# Patient Record
Sex: Female | Born: 1935 | Race: White | Hispanic: No | Smoking: Former smoker
Health system: Southern US, Community
[De-identification: ages and names within clinical notes are randomized; demographics above are authoritative.]

## PROBLEM LIST (undated history)

## (undated) DIAGNOSIS — U071 COVID-19: Secondary | ICD-10-CM

## (undated) DIAGNOSIS — Z8719 Personal history of other diseases of the digestive system: Secondary | ICD-10-CM

## (undated) DIAGNOSIS — T7840XA Allergy, unspecified, initial encounter: Secondary | ICD-10-CM

## (undated) DIAGNOSIS — I10 Essential (primary) hypertension: Secondary | ICD-10-CM

## (undated) HISTORY — DX: Essential (primary) hypertension: I10

## (undated) HISTORY — DX: COVID-19: U07.1

## (undated) HISTORY — PX: TONSILLECTOMY: SUR1361

## (undated) HISTORY — DX: Allergy, unspecified, initial encounter: T78.40XA

## (undated) HISTORY — PX: SEPTOPLASTY: SUR1290

## (undated) HISTORY — DX: Personal history of other diseases of the digestive system: Z87.19

---

## 2017-02-09 ENCOUNTER — Ambulatory Visit (INDEPENDENT_AMBULATORY_CARE_PROVIDER_SITE_OTHER): Payer: Medicare Other | Admitting: Family Medicine

## 2017-02-09 ENCOUNTER — Encounter: Payer: Self-pay | Admitting: Family Medicine

## 2017-02-09 ENCOUNTER — Encounter (INDEPENDENT_AMBULATORY_CARE_PROVIDER_SITE_OTHER): Payer: Self-pay

## 2017-02-09 VITALS — BP 166/82 | HR 65 | Temp 98.7°F | Ht <= 58 in | Wt 133.6 lb

## 2017-02-09 DIAGNOSIS — M533 Sacrococcygeal disorders, not elsewhere classified: Secondary | ICD-10-CM | POA: Insufficient documentation

## 2017-02-09 DIAGNOSIS — R413 Other amnesia: Secondary | ICD-10-CM

## 2017-02-09 DIAGNOSIS — E663 Overweight: Secondary | ICD-10-CM

## 2017-02-09 DIAGNOSIS — E669 Obesity, unspecified: Secondary | ICD-10-CM | POA: Insufficient documentation

## 2017-02-09 DIAGNOSIS — I10 Essential (primary) hypertension: Secondary | ICD-10-CM | POA: Insufficient documentation

## 2017-02-09 DIAGNOSIS — H35379 Puckering of macula, unspecified eye: Secondary | ICD-10-CM | POA: Diagnosis not present

## 2017-02-09 MED ORDER — AMLODIPINE BESYLATE 5 MG PO TABS: 5.0000 mg | ORAL_TABLET | Freq: Every day | ORAL | 1 refills | Status: DC
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
## 2017-02-09 NOTE — Assessment & Plan Note (Signed)
Not at goal. Reports she was previously on 10 mg of amlodipine. We will increase her to 5 mg and check a CMP today.

## 2017-02-09 NOTE — Assessment & Plan Note (Signed)
Encouraged continued exercise and watching diet.

## 2017-02-09 NOTE — Assessment & Plan Note (Signed)
History of this. Refer to ophthalmology for follow-up.

## 2017-02-09 NOTE — Assessment & Plan Note (Signed)
Passed word recall. Encouraged to continue to monitor memory.
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
## 2017-02-09 NOTE — Patient Instructions (Signed)
Nice to see you. We are going to increase your amlodipine to 5 mg daily. We'll check lab work today. Please try to avoid sitting on hard surfaces for your tailbone. We'll get you to see ophthalmology as well.  I'll see back in one month to recheck blood pressure.
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
## 2017-02-10 LAB — COMPREHENSIVE METABOLIC PANEL
ALT: 14 U/L (ref 6–29)
AST: 20 U/L (ref 10–35)
Albumin: 4.5 g/dL (ref 3.6–5.1)
Alkaline Phosphatase: 67 U/L (ref 33–130)
BILIRUBIN TOTAL: 0.3 mg/dL (ref 0.2–1.2)
BUN: 11 mg/dL (ref 7–25)
CALCIUM: 10.2 mg/dL (ref 8.6–10.4)
CO2: 22 mmol/L (ref 20–31)
Chloride: 104 mmol/L (ref 98–110)
Creat: 0.76 mg/dL (ref 0.60–0.88)
GLUCOSE: 94 mg/dL (ref 65–99)
POTASSIUM: 4.4 mmol/L (ref 3.5–5.3)
Sodium: 139 mmol/L (ref 135–146)
Total Protein: 6.9 g/dL (ref 6.1–8.1)

## 2017-02-14 ENCOUNTER — Encounter: Payer: Self-pay | Admitting: Family Medicine

## 2017-03-23 ENCOUNTER — Ambulatory Visit (INDEPENDENT_AMBULATORY_CARE_PROVIDER_SITE_OTHER): Payer: Medicare Other | Admitting: Family Medicine

## 2017-03-23 ENCOUNTER — Encounter: Payer: Self-pay | Admitting: Family Medicine

## 2017-03-23 DIAGNOSIS — I10 Essential (primary) hypertension: Secondary | ICD-10-CM

## 2017-03-23 DIAGNOSIS — R9389 Abnormal findings on diagnostic imaging of other specified body structures: Secondary | ICD-10-CM

## 2017-03-23 DIAGNOSIS — R938 Abnormal findings on diagnostic imaging of other specified body structures: Secondary | ICD-10-CM

## 2017-03-23 DIAGNOSIS — R911 Solitary pulmonary nodule: Secondary | ICD-10-CM | POA: Insufficient documentation

## 2017-03-23 NOTE — Patient Instructions (Signed)
Nice to see you. Please continue the amlodipine. Please continue to monitor your blood pressure. If you start having lightheadedness please let us know. If your blood pressure starts to run significantly higher please let us know.
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
## 2017-03-23 NOTE — Assessment & Plan Note (Signed)
At goal for age. Continue current medications. Continue to monitor.
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
## 2017-04-02 DIAGNOSIS — H35372 Puckering of macula, left eye: Secondary | ICD-10-CM | POA: Diagnosis not present

## 2017-08-21 ENCOUNTER — Other Ambulatory Visit: Payer: Self-pay | Admitting: Family Medicine

## 2017-09-24 ENCOUNTER — Other Ambulatory Visit: Payer: Self-pay

## 2017-09-24 ENCOUNTER — Ambulatory Visit (INDEPENDENT_AMBULATORY_CARE_PROVIDER_SITE_OTHER): Payer: Medicare Other | Admitting: Family Medicine

## 2017-09-24 ENCOUNTER — Encounter: Payer: Self-pay | Admitting: Family Medicine

## 2017-09-24 DIAGNOSIS — R911 Solitary pulmonary nodule: Secondary | ICD-10-CM | POA: Diagnosis not present

## 2017-09-24 DIAGNOSIS — W19XXXA Unspecified fall, initial encounter: Secondary | ICD-10-CM | POA: Insufficient documentation

## 2017-09-24 DIAGNOSIS — I10 Essential (primary) hypertension: Secondary | ICD-10-CM

## 2017-09-24 DIAGNOSIS — R918 Other nonspecific abnormal finding of lung field: Secondary | ICD-10-CM

## 2017-09-24 MED ORDER — AMLODIPINE BESYLATE 10 MG PO TABS: 10.0000 mg | ORAL_TABLET | Freq: Every day | ORAL | 1 refills | Status: DC

## 2017-09-24 NOTE — Patient Instructions (Signed)
Nice to see you. We will get you set up for follow-up of the lung nodule that was previously seen on CT scan. We will increase you amlodipine to 10 mg daily.  Please check your blood pressure daily.  We will have you return in 2 weeks for BP check with nursing.

## 2017-09-24 NOTE — Progress Notes (Signed)
Gail AlarEric Boen Sterbenz, MD Phone: [phone]  [name] is a 84 y.o. female who presents today for follow-up.  Hypertension: Typically running between 150 and 160 systolically over 80s.  Taking amlodipine.  No chest pain or shortness of breath.  No edema.  She is not doing much exercise though is staying active.  Does eat fairly healthily with oatmeal and water for breakfast, salad and nuts for lunch, and a meat and vegetable for dinner.  She does add a little salt to her diet.  We finally did receive the CT scans from prior workup at an outside hospital.  She did have a lung nodule that was calcified which was felt to possibly be a granuloma.  She does have a smoking history in the distant past.  Quit 40 years ago.  Smoked less than 5 cigarettes/day when she did smoke.  She states she was recommended to have follow-up imaging.  Patient notes recurrent falls over the years.  Last fall was a week ago with no injury.  She has not had any injuries with these.  Several times they have occurred from the first or second step of a ladder.  She notes she will just be walking along at times and feel little off balance and then fall over.  Occasionally she will trip.  She notes no neurological symptoms with this.  Social History   Tobacco Use  Smoking Status Former Smoker  Smokeless Tobacco Never Used     ROS see history of present illness  Objective  Physical Exam Vitals:   09/24/17 0802 09/24/17 0815  BP: (!) 160/90 (!) 150/90  Pulse: 65   Temp: 97.9 F (36.6 C)   SpO2: 97%     BP Readings from Last 3 Encounters:  09/24/17 (!) 150/90  03/23/17 (!) 150/80  02/09/17 (!) 166/82   Wt Readings from Last 3 Encounters:  09/24/17 134 lb 3.2 oz (60.9 kg)  03/23/17 135 lb (61.2 kg)  02/09/17 133 lb 9.6 oz (60.6 kg)    Physical Exam  Constitutional: No distress.  Cardiovascular: Normal rate, regular rhythm and normal heart sounds.  Pulmonary/Chest: Effort normal and breath sounds  normal.  Musculoskeletal: She exhibits no edema.  Neurological: She is alert. Gait normal.  CN 2-12 intact, 5/5 strength in bilateral biceps, triceps, grip, quads, hamstrings, plantar and dorsiflexion, sensation to light touch intact in bilateral UE and LE, normal finger to nose, normal rapid alternating movements  Skin: Skin is warm and dry. She is not diaphoretic.     Assessment/Plan: Please see individual problem list.  Hypertension Above goal.  Increase amlodipine.  She will continue to check at home.  Recheck in 2 weeks with nursing.  Lung nodule seen on imaging study Possible calcified granuloma.  Does have a history of smoking.  Repeat CT scan ordered.  Falls Patient with recurrent issues with falls.  No injuries.  Neurologically intact.  Discussed seeing neurology given her sensation of imbalance though she declined.  Also offered physical therapy referral.  She wants to monitor.  If worsens or changes she will let us know.   Orders Placed This Encounter  Procedures  . CT CHEST NODULE FOLLOW UP LOW DOSE W/O    Standing Status:   Future    Standing Expiration Date:   11/23/2018    Order Specific Question:   Preferred imaging location?    Answer:   [facility]    Order Specific Question:   Radiology Contrast Protocol - do NOT remove file  path    Answer:   [email or web address]    Meds ordered this encounter  Medications  . amLODipine (NORVASC) 10 MG tablet    Sig: Take 1 tablet (10 mg total) by mouth daily.    Dispense:  90 tablet    Refill:  1     Gail Alar, MD [facility]

## 2017-09-24 NOTE — Assessment & Plan Note (Signed)
Possible calcified granuloma.  Does have a history of smoking.  Repeat CT scan ordered.

## 2017-09-24 NOTE — Assessment & Plan Note (Signed)
Patient with recurrent issues with falls.  No injuries.  Neurologically intact.  Discussed seeing neurology given her sensation of imbalance though she declined.  Also offered physical therapy referral.  She wants to monitor.  If worsens or changes she will let us know.

## 2017-09-24 NOTE — Assessment & Plan Note (Signed)
Above goal.  Increase amlodipine.  She will continue to check at home.  Recheck in 2 weeks with nursing.

## 2017-10-09 ENCOUNTER — Ambulatory Visit (INDEPENDENT_AMBULATORY_CARE_PROVIDER_SITE_OTHER): Payer: Medicare Other | Admitting: *Deleted

## 2017-10-09 DIAGNOSIS — I10 Essential (primary) hypertension: Secondary | ICD-10-CM

## 2017-10-09 NOTE — Progress Notes (Signed)
Patient came into office for 2 week follow up on BP with nurse after increasing amlodipine to 10 mg daily. BP taken in left arm 158/88 pulse 70 allowed patient to rest additional 15 minutes bp retaken left arm 140/80 pulse 78. Patient also left copy of home readings given to CMA.

## 2017-10-10 ENCOUNTER — Ambulatory Visit
Admission: RE | Admit: 2017-10-10 | Discharge: 2017-10-10 | Disposition: A | Discharge status: Home / Self Care | Payer: Medicare Other | Source: Ambulatory Visit | Attending: Family Medicine | Admitting: Family Medicine

## 2017-10-10 DIAGNOSIS — R918 Other nonspecific abnormal finding of lung field: Secondary | ICD-10-CM | POA: Diagnosis not present

## 2017-10-10 DIAGNOSIS — I700 Atherosclerosis of aorta: Secondary | ICD-10-CM | POA: Insufficient documentation

## 2017-10-10 DIAGNOSIS — R911 Solitary pulmonary nodule: Secondary | ICD-10-CM | POA: Insufficient documentation

## 2017-10-10 DIAGNOSIS — K8020 Calculus of gallbladder without cholecystitis without obstruction: Secondary | ICD-10-CM | POA: Diagnosis not present

## 2017-10-11 ENCOUNTER — Other Ambulatory Visit: Payer: Self-pay | Admitting: Family Medicine

## 2017-10-11 DIAGNOSIS — R918 Other nonspecific abnormal finding of lung field: Secondary | ICD-10-CM

## 2017-10-11 DIAGNOSIS — E041 Nontoxic single thyroid nodule: Secondary | ICD-10-CM

## 2017-10-14 NOTE — Progress Notes (Signed)
BP adequately controlled on recheck. I will follow-up with CMA regarding readings.

## 2017-10-16 ENCOUNTER — Ambulatory Visit
Admission: RE | Admit: 2017-10-16 | Discharge: 2017-10-16 | Disposition: A | Discharge status: Home / Self Care | Payer: Medicare Other | Source: Ambulatory Visit | Attending: Family Medicine | Admitting: Family Medicine

## 2017-10-16 DIAGNOSIS — E042 Nontoxic multinodular goiter: Secondary | ICD-10-CM | POA: Insufficient documentation

## 2017-10-16 DIAGNOSIS — E041 Nontoxic single thyroid nodule: Secondary | ICD-10-CM

## 2017-10-16 NOTE — Progress Notes (Signed)
In your folder 

## 2017-10-16 NOTE — Progress Notes (Signed)
Left message to return call to inform of DR.Sonnenbergs message below, ok for pec to speak to patient   Patient's blood pressure is mostly well controlled at home.  The majority of her blood pressures are less than 150 systolically.  She should continue to monitor and if she starts to have blood pressures greater than 150/90 systolically she should let us know.

## 2017-10-16 NOTE — Progress Notes (Signed)
Patient's blood pressure is mostly well controlled at home.  The majority of her blood pressures are less than 150 systolically.  She should continue to monitor and if she starts to have blood pressures greater than 150/90 systolically she should let us know.

## 2017-10-17 ENCOUNTER — Other Ambulatory Visit: Payer: Self-pay | Admitting: Family Medicine

## 2017-10-17 DIAGNOSIS — E041 Nontoxic single thyroid nodule: Secondary | ICD-10-CM

## 2017-11-06 DIAGNOSIS — E041 Nontoxic single thyroid nodule: Secondary | ICD-10-CM | POA: Diagnosis not present

## 2017-11-09 ENCOUNTER — Other Ambulatory Visit: Payer: Self-pay | Admitting: Otolaryngology

## 2017-11-09 DIAGNOSIS — E041 Nontoxic single thyroid nodule: Secondary | ICD-10-CM

## 2017-11-19 ENCOUNTER — Ambulatory Visit
Admission: RE | Admit: 2017-11-19 | Discharge: 2017-11-19 | Disposition: A | Discharge status: Home / Self Care | Payer: Medicare Other | Source: Ambulatory Visit | Attending: Otolaryngology | Admitting: Otolaryngology

## 2017-11-19 DIAGNOSIS — E041 Nontoxic single thyroid nodule: Secondary | ICD-10-CM | POA: Insufficient documentation

## 2017-11-19 NOTE — Procedures (Signed)
Interventional Radiology Procedure Note  Procedure: US guided bx of left thyroid nodule.  AFIRMA also sent Complications: None Recommendations:  - Ok to DC now - Routine care   Signed,  Yvone NeuJaime S. Loreta AveWagner, DO

## 2017-11-21 ENCOUNTER — Telehealth: Payer: Self-pay

## 2017-11-21 NOTE — Telephone Encounter (Signed)
Copied from CRM [id number]. Topic: Quick Communication - Other Results >> Nov 21, 2017  9:01 AM Crist InfanteHarrald, Kathy J wrote: Pt would like results of thyroid biopsy done 3/11.  Pt is concerned because this was done in a different way with a [facility] dr[facility] Pt instructed to call her pcp for results Dr Antony HasteSonnenburg ordered the biopsy. Pt states she is going out at 10 am for a little while and ok to leave message.

## 2017-11-21 NOTE — Telephone Encounter (Signed)
Please advise 

## 2017-11-21 NOTE — Telephone Encounter (Signed)
Patient notified and states she called dr Andee Polesvaught and they informed her they will call after he gets out of surgery

## 2017-11-21 NOTE — Telephone Encounter (Signed)
Please let the patient know that I do not see the results yet. It appears she saw Dr Andee PolesVaught for this and she should contact his office to see if they have received the results. If we get the results we will let her know, though I suspect they will go to Dr Andee PolesVaught. Thanks.

## 2017-11-22 LAB — CYTOLOGY - NON PAP

## 2018-01-30 ENCOUNTER — Encounter: Payer: Self-pay | Admitting: Family

## 2018-01-30 ENCOUNTER — Ambulatory Visit
Admission: RE | Admit: 2018-01-30 | Discharge: 2018-01-30 | Disposition: A | Discharge status: Home / Self Care | Payer: Medicare Other | Source: Ambulatory Visit | Attending: Family | Admitting: Family

## 2018-01-30 ENCOUNTER — Ambulatory Visit (INDEPENDENT_AMBULATORY_CARE_PROVIDER_SITE_OTHER): Payer: Medicare Other | Admitting: Family

## 2018-01-30 ENCOUNTER — Ambulatory Visit: Payer: Self-pay | Admitting: *Deleted

## 2018-01-30 VITALS — BP 152/74 | HR 65 | Temp 98.2°F | Resp 15 | Wt 136.2 lb

## 2018-01-30 DIAGNOSIS — I10 Essential (primary) hypertension: Secondary | ICD-10-CM

## 2018-01-30 DIAGNOSIS — M7989 Other specified soft tissue disorders: Secondary | ICD-10-CM | POA: Insufficient documentation

## 2018-01-30 DIAGNOSIS — R600 Localized edema: Secondary | ICD-10-CM | POA: Diagnosis not present

## 2018-01-30 MED ORDER — HYDROCHLOROTHIAZIDE 12.5 MG PO TABS: 12.5000 mg | ORAL_TABLET | Freq: Every day | ORAL | 0 refills | Status: DC

## 2018-01-30 NOTE — Telephone Encounter (Signed)
FYI patient will be seeing [name] today.
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
## 2018-01-30 NOTE — Patient Instructions (Addendum)
Non fasting labs on Monday to check electrolytes.   Follow up ONE week  Ultrasound of legs  Start HCTZ 12.5mg  in addition to amlodipine.   It may be the amlodipine making your legs swell.. However I am hesitant to decrease medication until your blood pressure is better controlled.  Please call us and let us know how you are doing  Managing Your Hypertension Hypertension is commonly called high blood pressure. This is when the force of your blood pressing against the walls of your arteries is too strong. Arteries are blood vessels that carry blood from your heart throughout your body. Hypertension forces the heart to work harder to pump blood, and may cause the arteries to become narrow or stiff. Having untreated or uncontrolled hypertension can cause heart attack, stroke, kidney disease, and other problems. What are blood pressure readings? A blood pressure reading consists of a higher number over a lower number. Ideally, your blood pressure should be below 120/80. The first ("top") number is called the systolic pressure. It is a measure of the pressure in your arteries as your heart beats. The second ("bottom") number is called the diastolic pressure. It is a measure of the pressure in your arteries as the heart relaxes. What does my blood pressure reading mean? Blood pressure is classified into four stages. Based on your blood pressure reading, your health care provider may use the following stages to determine what type of treatment you need, if any. Systolic pressure and diastolic pressure are measured in a unit called mm Hg. Normal  Systolic pressure: below 120.  Diastolic pressure: below 80. Elevated  Systolic pressure: 120-129.  Diastolic pressure: below 80. Hypertension stage 1  Systolic pressure: 130-139.  Diastolic pressure: 80-89. Hypertension stage 2  Systolic pressure: 140 or above.  Diastolic pressure: 90 or above. What health risks are associated with  hypertension? Managing your hypertension is an important responsibility. Uncontrolled hypertension can lead to:  A heart attack.  A stroke.  A weakened blood vessel (aneurysm).  Heart failure.  Kidney damage.  Eye damage.  Metabolic syndrome.  Memory and concentration problems.  What changes can I make to manage my hypertension? Hypertension can be managed by making lifestyle changes and possibly by taking medicines. Your health care provider will help you make a plan to bring your blood pressure within a normal range. Eating and drinking  Eat a diet that is high in fiber and potassium, and low in salt (sodium), added sugar, and fat. An example eating plan is called the DASH (Dietary Approaches to Stop Hypertension) diet. To eat this way: ? Eat plenty of fresh fruits and vegetables. Try to fill half of your plate at each meal with fruits and vegetables. ? Eat whole grains, such as whole wheat pasta, brown rice, or whole grain bread. Fill about one quarter of your plate with whole grains. ? Eat low-fat diary products. ? Avoid fatty cuts of meat, processed or cured meats, and poultry with skin. Fill about one quarter of your plate with lean proteins such as fish, chicken without skin, beans, eggs, and tofu. ? Avoid premade and processed foods. These tend to be higher in sodium, added sugar, and fat.  Reduce your daily sodium intake. Most people with hypertension should eat less than 1,500 mg of sodium a day.  Limit alcohol intake to no more than 1 drink a day for nonpregnant women and 2 drinks a day for men. One drink equals 12 oz of beer, 5 oz of wine,  or 1 oz of hard liquor. Lifestyle  Work with your health care provider to maintain a healthy body weight, or to lose weight. Ask what an ideal weight is for you.  Get at least 30 minutes of exercise that causes your heart to beat faster (aerobic exercise) most days of the week. Activities may include walking, swimming, or  biking.  Include exercise to strengthen your muscles (resistance exercise), such as weight lifting, as part of your weekly exercise routine. Try to do these types of exercises for 30 minutes at least 3 days a week.  Do not use any products that contain nicotine or tobacco, such as cigarettes and e-cigarettes. If you need help quitting, ask your health care provider.  Control any long-term (chronic) conditions you have, such as high cholesterol or diabetes. Monitoring  Monitor your blood pressure at home as told by your health care provider. Your personal target blood pressure may vary depending on your medical conditions, your age, and other factors.  Have your blood pressure checked regularly, as often as told by your health care provider. Working with your health care provider  Review all the medicines you take with your health care provider because there may be side effects or interactions.  Talk with your health care provider about your diet, exercise habits, and other lifestyle factors that may be contributing to hypertension.  Visit your health care provider regularly. Your health care provider can help you create and adjust your plan for managing hypertension. Will I need medicine to control my blood pressure? Your health care provider may prescribe medicine if lifestyle changes are not enough to get your blood pressure under control, and if:  Your systolic blood pressure is 130 or higher.  Your diastolic blood pressure is 80 or higher.  Take medicines only as told by your health care provider. Follow the directions carefully. Blood pressure medicines must be taken as prescribed. The medicine does not work as well when you skip doses. Skipping doses also puts you at risk for problems. Contact a health care provider if:  You think you are having a reaction to medicines you have taken.  You have repeated (recurrent) headaches.  You feel dizzy.  You have swelling in your  ankles.  You have trouble with your vision. Get help right away if:  You develop a severe headache or confusion.  You have unusual weakness or numbness, or you feel faint.  You have severe pain in your chest or abdomen.  You vomit repeatedly.  You have trouble breathing. Summary  Hypertension is when the force of blood pumping through your arteries is too strong. If this condition is not controlled, it may put you at risk for serious complications.  Your personal target blood pressure may vary depending on your medical conditions, your age, and other factors. For most people, a normal blood pressure is less than 120/80.  Hypertension is managed by lifestyle changes, medicines, or both. Lifestyle changes include weight loss, eating a healthy, low-sodium diet, exercising more, and limiting alcohol. This information is not intended to replace advice given to you by your health care provider. Make sure you discuss any questions you have with your health care provider. Document Released: 05/22/2012 Document Revised: 07/26/2016 Document Reviewed: 07/26/2016 Elsevier Interactive Patient Education  [employer].

## 2018-01-30 NOTE — Telephone Encounter (Addendum)
Pt called with complaints of bilateral leg swelling after eating corn beef hash; she had decided that drinking water "would wash that out"; this morning her legs are swollen from her knees to her feet; she also reports swelling of both hands; recommendations per nurse triage protocol to include seeing a physician within 24 hours; the pt normally sees Dr Birdie Sons but he has no availability within the parameters set per triage protocol;  pt offered and accepted with Rennie Plowman, [facility], today at 1500; will route to office for notification of this upcoming appointment.   Reason for Disposition . [1] MODERATE leg swelling (e.g., swelling extends up to knees) AND [2] new onset or worsening  Answer Assessment - Initial Assessment Questions 1. ONSET: "When did the swelling start?" (e.g., minutes, hours, days)     11/30/17 2. LOCATION: "What part of the leg is swollen?"  "Are both legs swollen or just one leg?"     Both legs from her knees to her feet 3. SEVERITY: "How bad is the swelling?" (e.g., localized; mild, moderate, severe)  - Localized - small area of swelling localized to one leg  - MILD pedal edema - swelling limited to foot and ankle, pitting edema < 1/4 inch (6 mm) deep, rest and elevation eliminate most or all swelling  - MODERATE edema - swelling of lower leg to knee, pitting edema > 1/4 inch (6 mm) deep, rest and elevation only partially reduce swelling  - SEVERE edema - swelling extends above knee, facial or hand swelling present      moderate 4. REDNESS: "Does the swelling look red or infected?"     no 5. PAIN: "Is the swelling painful to touch?" If so, ask: "How painful is it?"   (Scale 1-10; mild, moderate or severe)     no 6. FEVER: "Do you have a fever?" If so, ask: "What is it, how was it measured, and when did it start?"      no 7. CAUSE: "What do you think is causing the leg swelling?"     Ate salty food 8. MEDICAL HISTORY: "Do you have a history of heart failure,  kidney disease, liver failure, or cancer?"     hypertension 9. RECURRENT SYMPTOM: "Have you had leg swelling before?" If so, ask: "When was the last time?" "What happened that time?"     Yes; the pt states she was going upstairs and fell(6 years ago) 10. OTHER SYMPTOMS: "Do you have any other symptoms?" (e.g., chest pain, difficulty breathing)       no 11. PREGNANCY: "Is there any chance you are pregnant?" "When was your last menstrual period?"       no  Protocols used: LEG SWELLING AND EDEMA-A-AH

## 2018-02-01 ENCOUNTER — Encounter: Payer: Self-pay | Admitting: Family

## 2018-02-01 DIAGNOSIS — M7989 Other specified soft tissue disorders: Secondary | ICD-10-CM | POA: Insufficient documentation

## 2018-02-01 NOTE — Assessment & Plan Note (Signed)
Elevated today. Added hctz and patient will monitor blood pressure from home.  Close follow-up, appointment scheduled for 1 week.

## 2018-02-01 NOTE — Assessment & Plan Note (Addendum)
New.  Improved with elevation.  Suspect increased sodium intake as well as increased dose of amlodipine likely.  Pending BNP however low clinical suspicion for heart failure.  Blood pressures running high today, I have not decreased amlodipine dose however will consider this at follow-up.  We will go ahead and start patient on HCTZ, close follow-up.  Repeat labs in 5 days. BL [place] negative DVT.

## 2018-02-08 ENCOUNTER — Ambulatory Visit (INDEPENDENT_AMBULATORY_CARE_PROVIDER_SITE_OTHER): Payer: Medicare Other | Admitting: Family

## 2018-02-08 VITALS — BP 150/80 | HR 60 | Temp 98.3°F | Wt 135.0 lb

## 2018-02-08 DIAGNOSIS — I10 Essential (primary) hypertension: Secondary | ICD-10-CM | POA: Diagnosis not present

## 2018-02-08 LAB — BASIC METABOLIC PANEL
BUN: 13 mg/dL (ref 6–23)
CO2: 28 meq/L (ref 19–32)
CREATININE: 0.63 mg/dL (ref 0.40–1.20)
Calcium: 10.1 mg/dL (ref 8.4–10.5)
Chloride: 97 mEq/L (ref 96–112)
GFR: 95.57 mL/min (ref >60.00)
Glucose, Bld: 105 mg/dL — ABNORMAL HIGH (ref 70–99)
POTASSIUM: 4.1 meq/L (ref 3.5–5.1)
Sodium: 132 mEq/L — ABNORMAL LOW (ref 135–145)

## 2018-02-08 MED ORDER — LISINOPRIL 2.5 MG PO TABS: 2.5000 mg | ORAL_TABLET | Freq: Every day | ORAL | 1 refills | Status: DC

## 2018-02-08 NOTE — Progress Notes (Signed)
Subjective:    Patient ID: Gail Pierce, female    DOB: 10/18/1930, 84 y.o.   MRN: [id number]  CC: Gail Pierce is a 84 y.o. female who presents today for follow up.   HPI: Leg swelling has resolved. No leg pain.   HTN- No real change to blood pressure since starting HCTZ, 130/59, 131/68. Reports typically has had good blood pressure control. Notes over brother who has alzheimer's.   Denies exertional chest pain or pressure, numbness or tingling radiating to left arm or jaw, palpitations, dizziness, frequent headaches, changes in vision, or shortness of breath.    From [place]  HTN- started hctz one week ago   BL US- negative DVT   HISTORY:  Past Medical History:  Diagnosis Date  . Allergy   . Hx of diverticulitis of colon   . Hypertension    Past Surgical History:  Procedure Laterality Date  . SEPTOPLASTY    . TONSILLECTOMY     No family history on file.  Allergies: Patient has no known allergies. Current Outpatient Medications on File Prior to Visit  Medication Sig Dispense Refill  . amLODipine (NORVASC) 10 MG tablet Take 1 tablet (10 mg total) by mouth daily. 90 tablet 1  . Ascorbic Acid (VITAMIN C) 100 MG tablet Take 100 mg by mouth daily.    Marland Kitchen. aspirin EC 81 MG tablet Take 81 mg by mouth daily.    . Calcium Carbonate-Vitamin D (CALCIUM 500/D PO) Take by mouth.    . Cholecalciferol (VITAMIN D3) 1000 units CAPS Take by mouth.    Marland Kitchen. Corn Dextrin (EASY FIBER PO) Take by mouth.    . Cyanocobalamin 2500 MCG CHEW Chew by mouth.    . Glucosamine-Chondroit-Vit C-Mn (GLUCOSAMINE 1500 COMPLEX PO) Take by mouth.    . Multiple Vitamins-Minerals (CENTRUM ADULTS PO) Take by mouth.    . vitamin A 8000 UNIT capsule Take 8,000 Units by mouth daily.     No current facility-administered medications on file prior to visit.     Social History   Tobacco Use  . Smoking status: Former Games developermoker  . Smokeless tobacco: Never Used  Substance Use Topics  . Alcohol use: No  . Drug  use: No    Review of Systems  Constitutional: Negative for chills and fever.  Respiratory: Negative for cough and shortness of breath.   Cardiovascular: Negative for chest pain, palpitations and leg swelling (resolved).  Gastrointestinal: Negative for nausea and vomiting.      Objective:    BP (!) 150/80 Comment: right  Pulse 60   Temp 98.3 F (36.8 C)   Wt 135 lb (61.2 kg)   SpO2 98%   BMI 27.27 kg/m  BP Readings from Last 3 Encounters:  02/08/18 (!) 150/80  01/30/18 (!) 152/74  11/19/17 (!) 160/55   Wt Readings from Last 3 Encounters:  02/08/18 135 lb (61.2 kg)  01/30/18 136 lb 4 oz (61.8 kg)  11/19/17 135 lb (61.2 kg)    Physical Exam  Constitutional: She appears well-developed and well-nourished.  Eyes: Conjunctivae are normal.  Cardiovascular: Normal rate, regular rhythm, normal heart sounds and normal pulses.  No LE edema, palpable cords or masses. No erythema or increased warmth. No asymmetry in calf size when compared bilaterally LE hair growth symmetric and present. No discoloration of varicosities noted. LE warm and palpable pedal pulses.   Pulmonary/Chest: Effort normal and breath sounds normal. She has no wheezes. She has no rhonchi. She has no rales.  Neurological:  She is alert.  Skin: Skin is warm and dry.  Psychiatric: She has a normal mood and affect. Her speech is normal and behavior is normal. Thought content normal.  Vitals reviewed.      Assessment & Plan:   Problem List Items Addressed This Visit      Cardiovascular and Mediastinum   Hypertension - Primary    Swelling resolved. BP not quite at goal. No real improvement on HCTZ. DC HCTZ. Start low dose lisinopril. Repeat BMP today and again next week. She will f/u with PCP in July, sooner if BP is not at goal or she has side effects from lisinopril.       Relevant Medications   lisinopril (PRINIVIL,ZESTRIL) 2.5 MG tablet   Other Relevant Orders   Basic metabolic panel       I have  discontinued Gail M. Pierce's hydrochlorothiazide. I am also having her start on lisinopril. Additionally, I am having her maintain her Multiple Vitamins-Minerals (CENTRUM ADULTS PO), Calcium Carbonate-Vitamin D (CALCIUM 500/D PO), vitamin C, Glucosamine-Chondroit-Vit C-Mn (GLUCOSAMINE 1500 COMPLEX PO), Corn Dextrin (EASY FIBER PO), vitamin A, Cyanocobalamin, aspirin EC, Vitamin D3, and amLODipine.   Meds ordered this encounter  Medications  . lisinopril (PRINIVIL,ZESTRIL) 2.5 MG tablet    Sig: Take 1 tablet (2.5 mg total) by mouth daily.    Dispense:  90 tablet    Refill:  1    Order Specific Question:   Supervising Provider    Answer:   Sherlene Shams [[id number]]    Return precautions given.   Risks, benefits, and alternatives of the medications and treatment plan prescribed today were discussed, and patient expressed understanding.   Education regarding symptom management and diagnosis given to patient on AVS.  Continue to follow with Glori Luis, MD for routine health maintenance.   Dorris Carnes Varnum and I agreed with plan.   Rennie Plowman, FNP

## 2018-02-08 NOTE — Assessment & Plan Note (Signed)
Swelling resolved. BP not quite at goal. No real improvement on HCTZ. DC HCTZ. Start low dose lisinopril. Repeat BMP today and again next week. She will f/u with PCP in July, sooner if BP is not at goal or she has side effects from lisinopril.

## 2018-02-08 NOTE — Patient Instructions (Addendum)
Start lisinopril  Labs today AND again next Tuesday   Monitor blood pressure,  Goal is less than 130/80; if persistently higher, please make sooner follow up appointment so we can recheck you blood pressure and manage medications   Please ensure you feel okay on lisinopril ( not dizzy or blood pressure too low). If you feel okay, you may wait to see Birdie Sons as planned on July.    My pleasure seeing you

## 2018-02-11 ENCOUNTER — Other Ambulatory Visit: Payer: Self-pay | Admitting: Family

## 2018-02-11 NOTE — Progress Notes (Signed)
close

## 2018-02-12 ENCOUNTER — Other Ambulatory Visit (INDEPENDENT_AMBULATORY_CARE_PROVIDER_SITE_OTHER): Payer: Medicare Other

## 2018-02-12 DIAGNOSIS — I10 Essential (primary) hypertension: Secondary | ICD-10-CM | POA: Diagnosis not present

## 2018-02-12 DIAGNOSIS — M7989 Other specified soft tissue disorders: Secondary | ICD-10-CM | POA: Diagnosis not present

## 2018-02-12 LAB — COMPREHENSIVE METABOLIC PANEL
ALT: 15 U/L (ref 0–35)
AST: 18 U/L (ref 0–37)
Albumin: 4.3 g/dL (ref 3.5–5.2)
Alkaline Phosphatase: 60 U/L (ref 39–117)
BUN: 9 mg/dL (ref 6–23)
CHLORIDE: 102 meq/L (ref 96–112)
CO2: 27 mEq/L (ref 19–32)
Calcium: 10 mg/dL (ref 8.4–10.5)
Creatinine, Ser: 0.69 mg/dL (ref 0.40–1.20)
GFR: 86.05 mL/min (ref >60.00)
GLUCOSE: 102 mg/dL — AB (ref 70–99)
POTASSIUM: 4.2 meq/L (ref 3.5–5.1)
Sodium: 136 mEq/L (ref 135–145)
Total Bilirubin: 0.6 mg/dL (ref 0.2–1.2)
Total Protein: 6.8 g/dL (ref 6.0–8.3)

## 2018-02-12 LAB — BRAIN NATRIURETIC PEPTIDE: PRO B NATRI PEPTIDE: 197 pg/mL — AB (ref 0.0–100.0)

## 2018-03-25 ENCOUNTER — Encounter: Payer: Self-pay | Admitting: Family Medicine

## 2018-03-25 ENCOUNTER — Ambulatory Visit (INDEPENDENT_AMBULATORY_CARE_PROVIDER_SITE_OTHER): Payer: Medicare Other | Admitting: Family Medicine

## 2018-03-25 ENCOUNTER — Ambulatory Visit (INDEPENDENT_AMBULATORY_CARE_PROVIDER_SITE_OTHER): Payer: Medicare Other

## 2018-03-25 VITALS — BP 152/80 | HR 61 | Temp 98.0°F | Ht 59.0 in | Wt 137.0 lb

## 2018-03-25 VITALS — BP 152/80 | HR 61 | Temp 98.0°F | Resp 15 | Ht 59.0 in | Wt 137.0 lb

## 2018-03-25 DIAGNOSIS — E663 Overweight: Secondary | ICD-10-CM | POA: Diagnosis not present

## 2018-03-25 DIAGNOSIS — I10 Essential (primary) hypertension: Secondary | ICD-10-CM

## 2018-03-25 DIAGNOSIS — R911 Solitary pulmonary nodule: Secondary | ICD-10-CM

## 2018-03-25 DIAGNOSIS — M7989 Other specified soft tissue disorders: Secondary | ICD-10-CM | POA: Diagnosis not present

## 2018-03-25 DIAGNOSIS — Z0000 Encounter for general adult medical examination without abnormal findings: Secondary | ICD-10-CM | POA: Diagnosis not present

## 2018-03-25 NOTE — Progress Notes (Signed)
Subjective:   Gail Pierce is a 84 y.o. female who presents for an Initial Medicare Annual Wellness Visit.  Review of Systems    No ROS.  Medicare Wellness Visit. Additional risk factors are reflected in the social history.  Cardiac Risk Factors include: advanced age (>73men, >104 women);hypertension     Objective:    Today's Vitals   03/25/18 0836  BP: (!) 152/80  Pulse: 61  Resp: 15  Temp: 98 F (36.7 C)  TempSrc: Oral  SpO2: 99%  Weight: 137 lb (62.1 kg)  Height: 4\' 11"  (1.499 m)   Body mass index is 27.67 kg/m.  Advanced Directives 03/25/2018  Does Patient Have a Medical Advance Directive? Yes  Type of Estate agent of [place];Living will  Does patient want to make changes to medical advance directive? No - Patient declined  Copy of Healthcare Power of Attorney in Chart? No - copy requested    Current Medications (verified) Outpatient Encounter Medications as of 03/25/2018  Medication Sig  . Ascorbic Acid (VITAMIN C) 100 MG tablet Take 100 mg by mouth daily.  Marland Kitchen aspirin EC 81 MG tablet Take 81 mg by mouth daily.  . Calcium Carbonate-Vitamin D (CALCIUM 500/D PO) Take by mouth.  . Cholecalciferol (VITAMIN D3) 1000 units CAPS Take by mouth.  Marland Kitchen Corn Dextrin (EASY FIBER PO) Take by mouth.  . Cyanocobalamin 2500 MCG CHEW Chew by mouth.  . Glucosamine-Chondroit-Vit C-Mn (GLUCOSAMINE 1500 COMPLEX PO) Take by mouth.  Marland Kitchen lisinopril (PRINIVIL,ZESTRIL) 2.5 MG tablet Take 1 tablet (2.5 mg total) by mouth daily.  . Multiple Vitamins-Minerals (CENTRUM ADULTS PO) Take by mouth.  . vitamin A 8000 UNIT capsule Take 8,000 Units by mouth daily.   No facility-administered encounter medications on file as of 03/25/2018.     Allergies (verified) Patient has no known allergies.   History: Past Medical History:  Diagnosis Date  . Allergy   . Hx of diverticulitis of colon   . Hypertension    Past Surgical History:  Procedure Laterality Date  .  SEPTOPLASTY    . TONSILLECTOMY     Family History  Problem Relation Age of Onset  . Leukemia Mother   . Heart attack Father    Social History   Socioeconomic History  . Marital status: Single    Spouse name: Not on file  . Number of children: Not on file  . Years of education: Not on file  . Highest education level: Not on file  Occupational History  . Not on file  Social Needs  . Financial resource strain: Not hard at all  . Food insecurity:    Worry: Never true    Inability: Never true  . Transportation needs:    Medical: No    Non-medical: No  Tobacco Use  . Smoking status: Former Games developer  . Smokeless tobacco: Never Used  Substance and Sexual Activity  . Alcohol use: Yes    Comment: OCC  . Drug use: No  . Sexual activity: Not on file  Lifestyle  . Physical activity:    Days per week: 7 days    Minutes per session: 60 min  . Stress: Not at all  Relationships  . Social connections:    Talks on phone: Not on file    Gets together: Not on file    Attends religious service: Not on file    Active member of club or organization: Not on file    Attends meetings of clubs or organizations:  Not on file    Relationship status: Not on file  Other Topics Concern  . Not on file  Social History Narrative  . Not on file    Tobacco Counseling Counseling given: Not Answered   Clinical Intake:  Pre-visit preparation completed: Yes  Pain : No/denies pain     Nutritional Status: BMI 25 -29 Overweight Diabetes: No  How often do you need to have someone help you when you read instructions, pamphlets, or other written materials from your doctor or pharmacy?: 1 - Never  Interpreter Needed?: No      Activities of Daily Living In your present state of health, do you have any difficulty performing the following activities: 03/25/2018  Hearing? N  Vision? N  Difficulty concentrating or making decisions? N  Walking or climbing stairs? N  Dressing or bathing? N    Doing errands, shopping? N  Preparing Food and eating ? N  Using the Toilet? N  In the past six months, have you accidently leaked urine? N  Do you have problems with loss of bowel control? N  Managing your Medications? N  Managing your Finances? N  Housekeeping or managing your Housekeeping? N  Some recent data might be hidden     Immunizations and Health Maintenance Immunization History  Administered Date(s) Administered  . Influenza, High Dose Seasonal PF 05/28/2017  . Pneumococcal-Unspecified 09/11/2014  . Td 09/12/2015   Health Maintenance Due  Topic Date Due  . DEXA SCAN  08/15/1998  . PNA vac Low Risk Adult (2 of 2 - PCV13) 09/12/2015    Patient Care Team: Glori LuisSonnenberg, Eric G, MD as PCP - General (Family Medicine)  Indicate any recent Medical Services you may have received from other than [facility] providers in the past year (date may be approximate).     Assessment:   This is a routine wellness examination for Gail Pierce.  The goal of the wellness visit is to assist the patient how to close the gaps in care and create a preventative care plan for the patient.   The roster of all physicians providing medical care to patient is listed in the Snapshot section of the chart.  Taking calcium VIT D as appropriate/Osteoporosis risk reviewed.    Safety issues reviewed; Smoke and carbon monoxide detectors in the home. No firearms in the home. Wears seatbelts when driving or riding with others. No violence in the home.  They do not have excessive sun exposure.  Discussed the need for sun protection: hats, long sleeves and the use of sunscreen if there is significant sun exposure.  Patient is alert, normal appearance, oriented to person/place/and time. Correctly identified the president of the [place] and recalls of 2/3 words.Performs simple calculations and can read correct time from watch face. Displays appropriate judgement.  No new identified risk were noted.  No failures at ADL's  or IADL's.  Ambulates with cane as needed.   BMI- discussed the importance of a healthy diet, water intake and the benefits of aerobic exercise. She has a healthy diet, adequate water intake, and walks for exercise.   Dental- UTD.  Sleep patterns- Sleeps 8 hours at night.  Wakes feeling rested.    Dexa scan discussed; states ordered with pcp.   Pneumococcal vaccine discussed; states awaiting records.   Patient Concerns: None at this time. Follow up with PCP as needed.  Hearing/Vision screen Hearing Screening Comments: Patient is able to hear conversational tones without difficulty.  No issues reported.   Vision Screening Comments:  Followed by [facility] (Dr. Druscilla Brownie) Wears glasses when reading  Annual visits Cataract extraction, bilateral Visual acuity not assessed per patient preference since they have regular follow up with the ophthalmologist  Dietary issues and exercise activities discussed: Current Exercise Habits: Home exercise routine, Type of exercise: walking, Time (Minutes): 60, Frequency (Times/Week): 7, Weekly Exercise (Minutes/Week): 420, Intensity: Moderate  Goals    . Maintain Healthy Lifetsyle     Stay active Healthy diet Stay hydrated      Depression Screen PHQ 2/9 Scores 03/25/2018 03/23/2017 03/23/2017  PHQ - 2 Score 0 0 0  PHQ- 9 Score - 0 -    Fall Risk Fall Risk  03/25/2018 03/23/2017  Falls in the past year? No No    Cognitive Function: MMSE - Mini Mental State Exam 03/25/2018  Orientation to time 5  Orientation to Place 5  Registration 3  Attention/ Calculation 5  Recall 2  Language- name 2 objects 2  Language- repeat 1  Language- follow 3 step command 3  Language- read & follow direction 1  Write a sentence 1  Copy design 1  Total score 29        Screening Tests Health Maintenance  Topic Date Due  . DEXA SCAN  08/15/1998  . PNA vac Low Risk Adult (2 of 2 - PCV13) 09/12/2015  . INFLUENZA VACCINE  04/11/2018  .  TETANUS/TDAP  09/11/2025     Plan:   End of life planning; Advance aging; Advanced directives discussed. Copy of current HCPOA/Living Will requested.    I have personally reviewed and noted the following in the patient's chart:   . Medical and social history . Use of alcohol, tobacco or illicit drugs  . Current medications and supplements . Functional ability and status . Nutritional status . Physical activity . Advanced directives . List of other physicians . Hospitalizations, surgeries, and ER visits in previous 12 months . Vitals . Screenings to include cognitive, depression, and falls . Referrals and appointments  In addition, I have reviewed and discussed with patient certain preventive protocols, quality metrics, and best practice recommendations. A written personalized care plan for preventive services as well as general preventive health recommendations were provided to patient.     Ashok Pall, LPN   0/98/1191

## 2018-03-25 NOTE — Progress Notes (Signed)
  Marikay AlarEric Cyanne Delmar, MD Phone: [phone]  [name] is a 84 y.o. female who presents today for f/u.  CC: htn, lung nodule, overweight  HYPERTENSION  Disease Monitoring  Home BP Monitoring 139/68 Chest pain- no    Dyspnea- no Medications  Compliance-  Taking lisinopril  Edema- resolved  Lung nodule: Patient with smoking history.  No cough or hemoptysis.  Needs repeat scan.  Overweight: Describes diet is regular.  Goes out to eat about once a month.  Exercises by walking 1.5-2 hours daily with a good pace.  Started somewhat recently as she found a partner to walk with.    Social History   Tobacco Use  Smoking Status Former Smoker  Smokeless Tobacco Never Used     ROS see history of present illness  Objective  Physical Exam Vitals:   03/25/18 0802 03/25/18 0825  BP: (!) 170/88 (!) 152/80  Pulse: 61   Temp: 98 F (36.7 C)   SpO2: 99%     BP Readings from Last 3 Encounters:  03/25/18 (!) 152/80  02/08/18 (!) 150/80  01/30/18 (!) 152/74   Wt Readings from Last 3 Encounters:  03/25/18 137 lb (62.1 kg)  02/08/18 135 lb (61.2 kg)  01/30/18 136 lb 4 oz (61.8 kg)    Physical Exam  Constitutional: No distress.  Cardiovascular: Normal rate, regular rhythm and normal heart sounds.  Pulmonary/Chest: Effort normal and breath sounds normal.  Musculoskeletal: She exhibits no edema.  Neurological: She is alert.  Skin: Skin is warm and dry. She is not diaphoretic.     Assessment/Plan: Please see individual problem list.  Leg swelling Resolved.  Lung nodule seen on imaging study Chest CT previously ordered.  We will get this set up for her.  Overweight (BMI 25.0-29.9) Encourage continue exercise and monitoring diet.  Hypertension At goal at home.  She will continue lisinopril.  She did have follow-up metabolic panel.   Health Maintenance: DEXA scan ordered.  Records will be requested regarding pneumonia vaccine.  Orders Placed This Encounter    Procedures  . DG Bone Density    Standing Status:   Future    Standing Expiration Date:   05/27/2019    Order Specific Question:   Reason for Exam (SYMPTOM  OR DIAGNOSIS REQUIRED)    Answer:   postemenopausal estrogen deficiency    Order Specific Question:   Preferred imaging location?    Answer:   [facility]    No orders of the defined types were placed in this encounter.    Marikay AlarEric Ariel Wingrove, MD [facility]

## 2018-03-25 NOTE — Patient Instructions (Addendum)
  Gail Pierce , Thank you for taking time to come for your Medicare Wellness Visit. I appreciate your ongoing commitment to your health goals. Please review the following plan we discussed and let me know if I can assist you in the future.   These are the goals we discussed: Goals    . Maintain Healthy Lifetsyle     Stay active Healthy diet Stay hydrated       This is a list of the screening recommended for you and due dates:  Health Maintenance  Topic Date Due  . DEXA scan (bone density measurement)  08/15/1998  . Pneumonia vaccines (2 of 2 - PCV13) 09/12/2015  . Flu Shot  04/11/2018  . Tetanus Vaccine  09/11/2025   Bone Densitometry Bone densitometry is an imaging test that uses a special X-ray to measure the amount of calcium and other minerals in your bones (bone density). This test is also known as a bone mineral density test or dual-energy X-ray absorptiometry (DXA). The test can measure bone density at your hip and your spine. It is similar to having a regular X-ray. You may have this test to:  Diagnose a condition that causes weak or thin bones (osteoporosis).  Predict your risk of a broken bone (fracture).  Determine how well osteoporosis treatment is working.  Tell a health care provider about:  Any allergies you have.  All medicines you are taking, including vitamins, herbs, eye drops, creams, and over-the-counter medicines.  Any problems you or family members have had with anesthetic medicines.  Any blood disorders you have.  Any surgeries you have had.  Any medical conditions you have.  Possibility of pregnancy.  Any other medical test you had within the previous 14 days that used contrast material. What are the risks? Generally, this is a safe procedure. However, problems can occur and may include the following:  This test exposes you to a very small amount of radiation.  The risks of radiation exposure may be greater to unborn children.  What  happens before the procedure?  Do not take any calcium supplements for 24 hours before having the test. You can otherwise eat and drink what you usually do.  Take off all metal jewelry, eyeglasses, dental appliances, and any other metal objects. What happens during the procedure?  You may lie on an exam table. There will be an X-ray generator below you and an imaging device above you.  Other devices, such as boxes or braces, may be used to position your body properly for the scan.  You will need to lie still while the machine slowly scans your body.  The images will show up on a computer monitor. What happens after the procedure? You may need more testing at a later time. This information is not intended to replace advice given to you by your health care provider. Make sure you discuss any questions you have with your health care provider. Document Released: 09/19/2004 Document Revised: 02/03/2016 Document Reviewed: 02/05/2014 Elsevier Interactive Patient Education  2018 [employer]

## 2018-03-25 NOTE — Assessment & Plan Note (Signed)
Encourage continue exercise and monitoring diet.

## 2018-03-25 NOTE — Assessment & Plan Note (Signed)
At goal at home.  She will continue lisinopril.  She did have follow-up metabolic panel.

## 2018-03-25 NOTE — Assessment & Plan Note (Signed)
Chest CT previously ordered.  We will get this set up for her.

## 2018-03-25 NOTE — Assessment & Plan Note (Signed)
Resolved

## 2018-03-25 NOTE — Patient Instructions (Signed)
Nice to see you. We will get you set up for CT scan of your chest. Please continue to monitor your blood pressure and if it trends up please let us know. Please continue to exercise and monitor your diet.

## 2018-05-02 DIAGNOSIS — H35372 Puckering of macula, left eye: Secondary | ICD-10-CM | POA: Diagnosis not present

## 2018-05-02 DIAGNOSIS — H43813 Vitreous degeneration, bilateral: Secondary | ICD-10-CM | POA: Diagnosis not present

## 2018-05-14 ENCOUNTER — Encounter: Payer: Self-pay | Admitting: Family Medicine

## 2018-05-23 ENCOUNTER — Other Ambulatory Visit: Payer: Self-pay | Admitting: Family Medicine

## 2018-05-23 DIAGNOSIS — Z1231 Encounter for screening mammogram for malignant neoplasm of breast: Secondary | ICD-10-CM

## 2018-06-13 ENCOUNTER — Ambulatory Visit
Admission: RE | Admit: 2018-06-13 | Discharge: 2018-06-13 | Disposition: A | Discharge status: Home / Self Care | Payer: Medicare Other | Source: Ambulatory Visit | Attending: Family Medicine | Admitting: Family Medicine

## 2018-06-13 DIAGNOSIS — Z1231 Encounter for screening mammogram for malignant neoplasm of breast: Secondary | ICD-10-CM | POA: Diagnosis not present

## 2018-06-17 ENCOUNTER — Ambulatory Visit
Admission: RE | Admit: 2018-06-17 | Discharge: 2018-06-17 | Disposition: A | Discharge status: Home / Self Care | Payer: Medicare Other | Source: Ambulatory Visit | Attending: Family Medicine | Admitting: Family Medicine

## 2018-06-17 DIAGNOSIS — Z780 Asymptomatic menopausal state: Secondary | ICD-10-CM | POA: Insufficient documentation

## 2018-06-17 DIAGNOSIS — Z13820 Encounter for screening for osteoporosis: Secondary | ICD-10-CM | POA: Diagnosis not present

## 2018-06-17 DIAGNOSIS — E2839 Other primary ovarian failure: Secondary | ICD-10-CM | POA: Insufficient documentation

## 2018-06-17 DIAGNOSIS — I10 Essential (primary) hypertension: Secondary | ICD-10-CM

## 2018-06-17 DIAGNOSIS — M8589 Other specified disorders of bone density and structure, multiple sites: Secondary | ICD-10-CM | POA: Diagnosis not present

## 2018-06-17 DIAGNOSIS — M8588 Other specified disorders of bone density and structure, other site: Secondary | ICD-10-CM | POA: Insufficient documentation

## 2018-06-17 DIAGNOSIS — M85851 Other specified disorders of bone density and structure, right thigh: Secondary | ICD-10-CM | POA: Insufficient documentation

## 2018-07-15 NOTE — Progress Notes (Signed)
Called [place] and was placed on hold for a long period of time was not able to get through to anyone.

## 2018-08-01 ENCOUNTER — Other Ambulatory Visit: Payer: Self-pay | Admitting: Family

## 2018-08-01 DIAGNOSIS — I10 Essential (primary) hypertension: Secondary | ICD-10-CM

## 2018-08-05 ENCOUNTER — Encounter: Payer: Self-pay | Admitting: Family Medicine

## 2018-08-05 ENCOUNTER — Ambulatory Visit (INDEPENDENT_AMBULATORY_CARE_PROVIDER_SITE_OTHER): Payer: Medicare Other | Admitting: Family Medicine

## 2018-08-05 VITALS — BP 170/86 | HR 59 | Temp 98.3°F | Ht 59.0 in | Wt 136.8 lb

## 2018-08-05 DIAGNOSIS — M8580 Other specified disorders of bone density and structure, unspecified site: Secondary | ICD-10-CM | POA: Diagnosis not present

## 2018-08-05 DIAGNOSIS — R05 Cough: Secondary | ICD-10-CM | POA: Insufficient documentation

## 2018-08-05 DIAGNOSIS — R059 Cough, unspecified: Secondary | ICD-10-CM

## 2018-08-05 DIAGNOSIS — Z23 Encounter for immunization: Secondary | ICD-10-CM

## 2018-08-05 DIAGNOSIS — I10 Essential (primary) hypertension: Secondary | ICD-10-CM

## 2018-08-05 DIAGNOSIS — B351 Tinea unguium: Secondary | ICD-10-CM | POA: Diagnosis not present

## 2018-08-05 MED ORDER — AMLODIPINE BESYLATE 5 MG PO TABS: 5.0000 mg | ORAL_TABLET | Freq: Every day | ORAL | 3 refills | Status: DC

## 2018-08-05 NOTE — Progress Notes (Signed)
Tommi Rumps, MD Phone: [phone]  Gail Pierce is a 84 y.o. female who presents today for f/u.  CC: osteopenia, dry cough, htn, onychomycosis  Osteopenia: Recent DEXA scan with osteopenia though elevated hip fracture FRAX score.  She notes she is taking calcium and vitamin D.  She has had no issues with ulcers in her stomach and she has had no trouble swallowing.  She did take Fosamax for some time previously.  Dry cough: This has been going on for about 3 months.  She notes no productive symptoms.  No significant congestion.  No postnasal drip.  Rare sneezing.  No fevers.  She has been on lisinopril a little longer than the cough has been occurring.  Hypertension: Blood pressure has been running 160s-170s/60s-70s.  She has had no chest pain or shortness of breath.  No edema.  She previously was well controlled on amlodipine though was changed by 1 of our nurse practitioners to HCTZ and then to lisinopril.  Onychomycosis: Notes she has noticed this over the last 6 months.  Notes it is in her big toes and little toes.  She would like to see podiatry.  Patient reports her brother passed away recently.  He had dementia.  She has been doing fairly well with this.  Social History   Tobacco Use  Smoking Status Former Smoker  Smokeless Tobacco Never Used     ROS see history of present illness  Objective  Physical Exam Vitals:   08/05/18 1443 08/05/18 1551  BP: (!) 180/90 (!) 170/86  Pulse: (!) 59   Temp: 98.3 F (36.8 C)   SpO2: 99%     BP Readings from Last 3 Encounters:  08/05/18 (!) 170/86  03/25/18 (!) 152/80  03/25/18 (!) 152/80   Wt Readings from Last 3 Encounters:  08/05/18 136 lb 12.8 oz (62.1 kg)  03/25/18 137 lb (62.1 kg)  03/25/18 137 lb (62.1 kg)    Physical Exam  Constitutional: No distress.  Cardiovascular: Normal rate, regular rhythm and normal heart sounds.  Pulmonary/Chest: Effort normal and breath sounds normal.  Musculoskeletal: She  exhibits no edema.  Neurological: She is alert.  Skin: Skin is warm and dry. She is not diaphoretic.  Bilateral thickened and yellow great toenails and fifth toe toenails     Assessment/Plan: Please see individual problem list.  Hypertension Elevated.  Asymptomatic.  We will have her discontinue lisinopril.  We will place her back on amlodipine.  She will follow-up in 2 weeks for BP check with nursing.  Osteopenia Noted on DEXA scan.  Elevated FRAX risk score.  Discussed treatment for osteoporosis given her elevated FRAX score.  We will check calcium and vitamin D levels and then consider Fosamax.  Onychomycosis Refer to podiatry.  Cough Patient with dry cough.  I suspect this is related to her lisinopril.  We will have her discontinue this.  If it is not resolved by the time she sees [place] in 2 weeks for BP check she will let us know.  Offered support given that her brother passed away.  Advised that we are here if she needs anyone to talk to.  Orders Placed This Encounter  Procedures  . Vitamin D (25 hydroxy)  . Comp Met (CMET)  . CBC  . TSH  . Ambulatory referral to Podiatry    Referral Priority:   Routine    Referral Type:   Consultation    Referral Reason:   Specialty Services Required    Requested Specialty:  Podiatry    Number of Visits Requested:   1    Meds ordered this encounter  Medications  . amLODipine (NORVASC) 5 MG tablet    Sig: Take 1 tablet (5 mg total) by mouth daily.    Dispense:  90 tablet    Refill:  [place], MD [place]

## 2018-08-05 NOTE — Assessment & Plan Note (Signed)
Noted on DEXA scan.  Elevated FRAX risk score.  Discussed treatment for osteoporosis given her elevated FRAX score.  We will check calcium and vitamin D levels and then consider Fosamax.

## 2018-08-05 NOTE — Patient Instructions (Addendum)
Nice to see you. We will get labs today.  Please discontinue the lisinopril. We will start you back on amlodipine.

## 2018-08-05 NOTE — Assessment & Plan Note (Signed)
Patient with dry cough.  I suspect this is related to her lisinopril.  We will have her discontinue this.  If it is not resolved by the time she sees us in 2 weeks for BP check she will let us know.

## 2018-08-05 NOTE — Assessment & Plan Note (Signed)
Elevated.  Asymptomatic.  We will have her discontinue lisinopril.  We will place her back on amlodipine.  She will follow-up in 2 weeks for BP check with nursing.

## 2018-08-05 NOTE — Assessment & Plan Note (Signed)
Refer to podiatry

## 2018-08-06 ENCOUNTER — Ambulatory Visit: Payer: Medicare Other | Admitting: Family Medicine

## 2018-08-06 LAB — CBC
HCT: 39.1 % (ref 36.0–46.0)
Hemoglobin: 13.2 g/dL (ref 12.0–15.0)
MCHC: 33.8 g/dL (ref 30.0–36.0)
MCV: 90.3 fl (ref 78.0–100.0)
Platelets: 226 10*3/uL (ref 150.0–400.0)
RBC: 4.33 Mil/uL (ref 3.87–5.11)
RDW: 13.1 % (ref 11.5–15.5)
WBC: 5.9 10*3/uL (ref 4.0–10.5)

## 2018-08-06 LAB — TSH: TSH: 3.5 u[IU]/mL (ref 0.35–4.50)

## 2018-08-06 LAB — COMPREHENSIVE METABOLIC PANEL
ALBUMIN: 4.5 g/dL (ref 3.5–5.2)
ALK PHOS: 54 U/L (ref 39–117)
ALT: 16 U/L (ref 0–35)
AST: 23 U/L (ref 0–37)
BUN: 8 mg/dL (ref 6–23)
CHLORIDE: 102 meq/L (ref 96–112)
CO2: 26 mEq/L (ref 19–32)
CREATININE: 0.67 mg/dL (ref 0.40–1.20)
Calcium: 10.5 mg/dL (ref 8.4–10.5)
GFR: 88.92 mL/min (ref >60.00)
Glucose, Bld: 94 mg/dL (ref 70–99)
Potassium: 4.2 mEq/L (ref 3.5–5.1)
Sodium: 137 mEq/L (ref 135–145)
TOTAL PROTEIN: 7.3 g/dL (ref 6.0–8.3)
Total Bilirubin: 0.4 mg/dL (ref 0.2–1.2)

## 2018-08-06 LAB — VITAMIN D 25 HYDROXY (VIT D DEFICIENCY, FRACTURES): VITD: 94.55 ng/mL (ref 30.00–100.00)

## 2018-08-15 ENCOUNTER — Other Ambulatory Visit: Payer: Self-pay | Admitting: Family Medicine

## 2018-08-15 MED ORDER — ALENDRONATE SODIUM 70 MG PO TABS: 70.0000 mg | ORAL_TABLET | ORAL | 11 refills | Status: DC

## 2018-08-22 DIAGNOSIS — B351 Tinea unguium: Secondary | ICD-10-CM | POA: Diagnosis not present

## 2018-09-24 ENCOUNTER — Ambulatory Visit: Payer: Medicare Other | Admitting: Family Medicine

## 2018-09-25 ENCOUNTER — Ambulatory Visit: Payer: Medicare Other | Admitting: Family Medicine

## 2018-09-25 ENCOUNTER — Encounter: Payer: Self-pay | Admitting: Family Medicine

## 2018-09-25 ENCOUNTER — Ambulatory Visit (INDEPENDENT_AMBULATORY_CARE_PROVIDER_SITE_OTHER): Payer: Medicare Other | Admitting: Family Medicine

## 2018-09-25 DIAGNOSIS — I10 Essential (primary) hypertension: Secondary | ICD-10-CM | POA: Diagnosis not present

## 2018-09-25 DIAGNOSIS — M79671 Pain in right foot: Secondary | ICD-10-CM | POA: Diagnosis not present

## 2018-09-25 DIAGNOSIS — M8580 Other specified disorders of bone density and structure, unspecified site: Secondary | ICD-10-CM

## 2018-09-25 NOTE — Assessment & Plan Note (Signed)
Pain is resolved though there is an apparent bony enlargement.  I discussed potential for arthritic change, fracture, or tumor.  Discussed obtaining an x-ray though she declined this.  She will monitor and let us know she would like to proceed with x-ray.

## 2018-09-25 NOTE — Patient Instructions (Signed)
Nice to see you. Please start working on diet and exercise.  Please continue to monitor your blood pressure at home.  If it remains greater than 150/90 consistently please let us know. Please monitor your right foot and if you develop recurrent pain please let us know.

## 2018-09-25 NOTE — Progress Notes (Signed)
  Marikay Alar, MD Phone: [phone]  Gail Pierce is a 85 y.o. female who presents today for f/u.  CC: htn, osteoporosis, right foot abnormality  Hypertension: Typically running 150-160s systolically at home.  Taking amlodipine.  No chest pain, shortness of breath, or edema.  She does note she gained quite a bit of weight over the holidays with eating a lot.  She has also not been able to exercise as much.  Osteoporosis: She is taking Fosamax.  Has had no issues with swallowing.  No recent fractures.  She is taking calcium and vitamin D.  Right foot abnormality: She reports she saw podiatry and they advised her that there is nothing wrong with her feet.  She notes on the lateral aspect near the fifth head of the metatarsal she developed a bony enlargement that occurred all of a sudden.  There was some pain initially though that is gone away.  The enlargement has remained.  Social History   Tobacco Use  Smoking Status Former Smoker  Smokeless Tobacco Never Used     ROS see history of present illness  Objective  Physical Exam Vitals:   09/25/18 0804  BP: 140/80  Pulse: 62  Temp: 97.9 F (36.6 C)  SpO2: 98%    BP Readings from Last 3 Encounters:  09/25/18 140/80  08/05/18 (!) 170/86  03/25/18 (!) 152/80   Wt Readings from Last 3 Encounters:  09/25/18 140 lb 3.2 oz (63.6 kg)  08/05/18 136 lb 12.8 oz (62.1 kg)  03/25/18 137 lb (62.1 kg)    Physical Exam Constitutional:      General: She is not in acute distress.    Appearance: She is not diaphoretic.  Cardiovascular:     Rate and Rhythm: Normal rate and regular rhythm.     Heart sounds: Normal heart sounds.  Pulmonary:     Effort: Pulmonary effort is normal.     Breath sounds: Normal breath sounds.  Musculoskeletal:     Comments: Right foot near the head of the fifth metatarsal is a bony enlargement, this is nontender, no other apparent bony defects in her foot right, left foot no gross bony defects    Skin:    General: Skin is warm and dry.  Neurological:     Mental Status: She is alert.      Assessment/Plan: Please see individual problem list.  Hypertension Improved on recheck.  She will continue amlodipine.  Encourage diet and exercise.  Recheck in 6 weeks with nursing.  Osteopenia Continue Fosamax.  Right foot pain Pain is resolved though there is an apparent bony enlargement.  I discussed potential for arthritic change, fracture, or tumor.  Discussed obtaining an x-ray though she declined this.  She will monitor and let us know she would like to proceed with x-ray.    No orders of the defined types were placed in this encounter.   No orders of the defined types were placed in this encounter.    Marikay Alar, MD [facility]

## 2018-09-25 NOTE — Assessment & Plan Note (Signed)
Improved on recheck.  She will continue amlodipine.  Encourage diet and exercise.  Recheck in 6 weeks with nursing.

## 2018-09-25 NOTE — Assessment & Plan Note (Signed)
Continue Fosamax  

## 2018-10-01 ENCOUNTER — Ambulatory Visit (INDEPENDENT_AMBULATORY_CARE_PROVIDER_SITE_OTHER): Payer: Medicare Other | Admitting: Family Medicine

## 2018-10-01 ENCOUNTER — Ambulatory Visit (INDEPENDENT_AMBULATORY_CARE_PROVIDER_SITE_OTHER): Payer: Medicare Other

## 2018-10-01 VITALS — BP 156/86 | HR 70 | Temp 98.3°F | Resp 18 | Ht 59.0 in | Wt 142.2 lb

## 2018-10-01 DIAGNOSIS — R05 Cough: Secondary | ICD-10-CM | POA: Diagnosis not present

## 2018-10-01 DIAGNOSIS — J069 Acute upper respiratory infection, unspecified: Secondary | ICD-10-CM

## 2018-10-01 DIAGNOSIS — J449 Chronic obstructive pulmonary disease, unspecified: Secondary | ICD-10-CM | POA: Diagnosis not present

## 2018-10-01 DIAGNOSIS — R058 Other specified cough: Secondary | ICD-10-CM
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
## 2018-10-01 NOTE — Patient Instructions (Signed)
Keep taking the mucinex you bought at [employer] as it seems to be helping  Keep up good fluid intake and get plenty of rest  We will get chest xray today

## 2018-11-06 ENCOUNTER — Ambulatory Visit (INDEPENDENT_AMBULATORY_CARE_PROVIDER_SITE_OTHER): Payer: Medicare Other | Admitting: *Deleted

## 2018-11-06 VITALS — BP 138/84 | HR 68 | Resp 16

## 2018-11-06 DIAGNOSIS — I10 Essential (primary) hypertension: Secondary | ICD-10-CM | POA: Diagnosis not present

## 2018-11-06 NOTE — Progress Notes (Addendum)
Patient here for nurse visit BP check per order from 09/25/18  Patient reports compliance with prescribed BP medications: yes usually takes BP meds in the Am but no medications this morning,  Last dose of BP medication: 11/05/18  BP Readings from Last 3 Encounters:  11/06/18 138/84  10/01/18 (!) 156/86  09/25/18 140/80   Pulse Readings from Last 3 Encounters:  11/06/18 68  10/01/18 70  09/25/18 62      Patient verbalized understanding of instructions.   Henrene Pastor, LPN

## 2018-11-07 NOTE — Progress Notes (Signed)
Patient's blood pressure is well controlled.  She should continue with her current regimen.  I have no documentation of prior back pain discussion or prescribing Flexeril or prednisone.  If the patient is having back pain we should discuss in the office to determine appropriate treatment.

## 2018-11-11 NOTE — Progress Notes (Signed)
Patient notified and voiced understanding.

## 2018-11-19 ENCOUNTER — Ambulatory Visit (INDEPENDENT_AMBULATORY_CARE_PROVIDER_SITE_OTHER): Payer: Medicare Other | Admitting: Family Medicine

## 2018-11-19 ENCOUNTER — Encounter: Payer: Self-pay | Admitting: Family Medicine

## 2018-11-19 VITALS — BP 138/80 | HR 79 | Temp 98.3°F | Ht 59.0 in | Wt 144.6 lb

## 2018-11-19 DIAGNOSIS — I10 Essential (primary) hypertension: Secondary | ICD-10-CM

## 2019-01-24 ENCOUNTER — Encounter: Payer: Self-pay | Admitting: Family Medicine

## 2019-01-24 ENCOUNTER — Other Ambulatory Visit: Payer: Self-pay

## 2019-01-24 ENCOUNTER — Ambulatory Visit (INDEPENDENT_AMBULATORY_CARE_PROVIDER_SITE_OTHER): Payer: Medicare Other | Admitting: Family Medicine

## 2019-01-24 DIAGNOSIS — R918 Other nonspecific abnormal finding of lung field: Secondary | ICD-10-CM | POA: Diagnosis not present

## 2019-01-24 DIAGNOSIS — I10 Essential (primary) hypertension: Secondary | ICD-10-CM

## 2019-01-24 DIAGNOSIS — M8580 Other specified disorders of bone density and structure, unspecified site: Secondary | ICD-10-CM | POA: Diagnosis not present

## 2019-01-24 DIAGNOSIS — R109 Unspecified abdominal pain: Secondary | ICD-10-CM

## 2019-01-24 DIAGNOSIS — R911 Solitary pulmonary nodule: Secondary | ICD-10-CM

## 2019-01-24 DIAGNOSIS — H5789 Other specified disorders of eye and adnexa: Secondary | ICD-10-CM

## 2019-01-24 NOTE — Progress Notes (Signed)
Virtual Visit via telephone Note  This visit type was conducted due to national recommendations for restrictions regarding the COVID-19 pandemic (e.g. social distancing).  This format is felt to be most appropriate for this patient at this time.  All issues noted in this document were discussed and addressed.  No physical exam was performed (except for noted visual exam findings with Video Visits).   I connected with Gail Pierce today at 10:30 AM EDT by telephone and verified that I am speaking with the correct person using two identifiers. Location patient: home Location provider: work Persons participating in the virtual visit: patient, provider  I discussed the limitations, risks, security and privacy concerns of performing an evaluation and management service by telephone and the availability of in person appointments. I also discussed with the patient that there may be a patient responsible charge related to this service. The patient expressed understanding and agreed to proceed.  Interactive audio and video telecommunications were attempted between this provider and patient, however failed, due to patient having technical difficulties OR patient did not have access to video capability.  We continued and completed visit with audio only.  Reason for visit: Follow-up.  HPI: Hypertension: Ranging 135-144/67-80.  Taking amlodipine.  No chest pain, shortness of breath, or edema.  Osteoporosis: Continues on Fosamax.  She has had no issues with this.  Chronic cough: She notes her prior cough resolved.  Has not recurred.  Lung nodule: She is due for a repeat chest CT. She was a smoker for many years.  No hemoptysis.  Left eye redness: She notes she talk to her ophthalmologist regarding this.  They noted she probably hit her eye.  She notes the redness resolved within a day with dry eyedrops.  Stomach discomfort: Patient notes she ate some old whip cream back in March and noted stomach  pain and gas after that that lasted for several weeks though has resolved and not recurred.  No blood in her stool.  No diarrhea.  She took Pepto-Bismol and Imodium with little benefit.   ROS: See pertinent positives and negatives per HPI.  Past Medical History:  Diagnosis Date  . Allergy   . Hx of diverticulitis of colon   . Hypertension     Past Surgical History:  Procedure Laterality Date  . SEPTOPLASTY    . TONSILLECTOMY      Family History  Problem Relation Age of Onset  . Leukemia Mother   . Heart attack Father     SOCIAL HX: Former smoker.   Current Outpatient Medications:  .  alendronate (FOSAMAX) 70 MG tablet, Take 1 tablet (70 mg total) by mouth every 7 (seven) days. Take with a full glass of water on an empty stomach., Disp: 4 tablet, Rfl: 11 .  amLODipine (NORVASC) 5 MG tablet, Take 1 tablet (5 mg total) by mouth daily., Disp: 90 tablet, Rfl: 3 .  Ascorbic Acid (VITAMIN C) 100 MG tablet, Take 100 mg by mouth daily., Disp: , Rfl:  .  aspirin EC 81 MG tablet, Take 81 mg by mouth daily., Disp: , Rfl:  .  Calcium Carbonate-Vitamin D (CALCIUM 500/D PO), Take by mouth., Disp: , Rfl:  .  Cholecalciferol (VITAMIN D3) 1000 units CAPS, Take by mouth., Disp: , Rfl:  .  Corn Dextrin (EASY FIBER PO), Take by mouth., Disp: , Rfl:  .  Cyanocobalamin 2500 MCG CHEW, Chew by mouth., Disp: , Rfl:  .  DHA-EPA-Flaxseed Oil-Vitamin E (THERA TEARS NUTRITION PO), Take by  mouth., Disp: , Rfl:  .  Glucosamine-Chondroit-Vit C-Mn (GLUCOSAMINE 1500 COMPLEX PO), Take by mouth., Disp: , Rfl:  .  Multiple Vitamins-Minerals (CENTRUM ADULTS PO), Take by mouth., Disp: , Rfl:  .  vitamin A 8000 UNIT capsule, Take 8,000 Units by mouth daily., Disp: , Rfl:   EXAM: This is a telehealth telephone visit and thus no physical exam was completed.  ASSESSMENT AND PLAN:  Discussed the following assessment and plan:  Abnormal findings on diagnostic imaging of lung - Plan: CT Chest Wo Contrast   Essential hypertension  Osteopenia, unspecified location  Lung nodule seen on imaging study  Abdominal pain, unspecified abdominal location  Redness of left eye  Hypertension Adequately controlled for age.  Continue current regimen.  Osteopenia Continue Fosamax.  Lung nodule seen on imaging study Follow-up CT ordered.  Abdominal pain Likely related to a food related issue.  Has not recurred.  She will monitor.  Redness of left eye She discussed this with her ophthalmologist.  This has resolved and not recurred.  CMA will contact patient to get her scheduled for follow-up in 6 months.  Social distancing precautions and sick precautions given regarding COVID-19.   I discussed the assessment and treatment plan with the patient. The patient was provided an opportunity to ask questions and all were answered. The patient agreed with the plan and demonstrated an understanding of the instructions.   The patient was advised to call back or seek an in-person evaluation if the symptoms worsen or if the condition fails to improve as anticipated.  I provided 23 minutes of non-face-to-face time during this encounter.   Marikay AlarEric Jady Braggs, MD

## 2019-01-28 ENCOUNTER — Telehealth: Payer: Self-pay | Admitting: Family Medicine

## 2019-01-28 DIAGNOSIS — H5789 Other specified disorders of eye and adnexa: Secondary | ICD-10-CM | POA: Insufficient documentation

## 2019-01-28 DIAGNOSIS — R109 Unspecified abdominal pain: Secondary | ICD-10-CM | POA: Insufficient documentation

## 2019-01-28 NOTE — Assessment & Plan Note (Signed)
Continue Fosamax  

## 2019-01-28 NOTE — Assessment & Plan Note (Signed)
Follow-up CT ordered 

## 2019-01-28 NOTE — Telephone Encounter (Signed)
Please call the patient and get her set up for follow-up in 6 months.  Please also cancel her appointment with me on July 15.  Thanks.

## 2019-01-28 NOTE — Telephone Encounter (Signed)
Appt scheduled, and other appt cancelled

## 2019-01-28 NOTE — Assessment & Plan Note (Signed)
Adequately controlled for age.  Continue current regimen. 

## 2019-01-28 NOTE — Assessment & Plan Note (Signed)
She discussed this with her ophthalmologist.  This has resolved and not recurred.

## 2019-01-28 NOTE — Assessment & Plan Note (Signed)
Likely related to a food related issue.  Has not recurred.  She will monitor.

## 2019-01-30 ENCOUNTER — Ambulatory Visit
Admission: RE | Admit: 2019-01-30 | Discharge: 2019-01-30 | Disposition: A | Discharge status: Home / Self Care | Payer: Medicare Other | Source: Ambulatory Visit | Attending: Family Medicine | Admitting: Family Medicine

## 2019-01-30 ENCOUNTER — Other Ambulatory Visit: Payer: Self-pay

## 2019-01-30 DIAGNOSIS — R918 Other nonspecific abnormal finding of lung field: Secondary | ICD-10-CM | POA: Diagnosis not present

## 2019-02-07 ENCOUNTER — Telehealth: Payer: Self-pay | Admitting: Family Medicine

## 2019-02-07 NOTE — Telephone Encounter (Signed)
Copied from CRM [id number]. Topic: General - Other >> Feb 07, 2019  2:12 PM Tamela Oddi wrote: Reason for CRM: Patient is returning a call she received regarding her x-ray results.  Please call patient back at [phone]

## 2019-02-19 ENCOUNTER — Other Ambulatory Visit: Payer: Self-pay | Admitting: Oncology

## 2019-02-19 ENCOUNTER — Other Ambulatory Visit: Payer: Self-pay | Admitting: *Deleted

## 2019-02-19 DIAGNOSIS — R911 Solitary pulmonary nodule: Secondary | ICD-10-CM

## 2019-02-19 NOTE — Progress Notes (Signed)
  Pulmonary Nodule Clinic Telephone Note  Received referral from PCP, Dr. Caryl Bis.   Per most recent guidelines and recommendations from [place] (2017), this patient requires a CT scan without contrast in 12 months from last scan and follow-up visit in the pulmonary nodule clinic a few days later.    I have personally reviewed all patient's previous imaging. Last CT scan completed on  01/30/19  revealed stable bilateral pulmonary nodules but new groundglass opacity in right lower lobe measuring approximately 7 mm.  Adenocarcinoma cannot be excluded.  Previous imaging with CT chest without contrast on 10/10/2017 revealed small bilateral lung nodules including 6 nodules in the left upper lobe, largest measuring 4 mm in size and a 2 mm right middle lobe lung nodule. Per Fleischner guidelines (2017), CT scan without contrast is recommended in 12 months and if stable repeat CT scan at 18 to 24 months (from first scan) to ensure stability in high risk patients.  High risk factors include: History of heavy smoking, exposure to asbestos, radium or uranium, personal family history of lung cancer, older age, sex (females greater than males), race (black and native [place] greater than weight), marginal speculation, upper lobe location, multiplicity (less than 5 nodules increases risk for malignancy) and emphysema and/or pulmonary fibrosis.   This recommendation follows the consensus statement: Guidelines for Management of Incidental Pulmonary Nodules Detected on CT Images: From the Fleischner Society 2017; Radiology 2017; 284:228-243.    I have placed order for CT scan without contrast to be completed approximately 12 months from previous CT scan.  I would like her to see me in our Pulmonary Nodule Clinic after her CT scan.  Faythe Casa, NP 02/19/2019 10:55 AM

## 2019-02-24 ENCOUNTER — Telehealth: Payer: Self-pay

## 2019-02-24 NOTE — Telephone Encounter (Signed)
Please see if she can do a virtual visit for this. Given the cough we can not see her in clinic, though could do a virtual visit. She could do this with me or Lauren.

## 2019-02-24 NOTE — Telephone Encounter (Signed)
Pt called back returning your call. She was speaking about more than a appt.   Call pt @ [phone] Thank you!

## 2019-02-24 NOTE — Telephone Encounter (Signed)
Pt was already informed and waiting for respond from Dr. Caryl Bis about a new program.  Gail Pierce

## 2019-02-24 NOTE — Telephone Encounter (Signed)
Called and spoke with the pt awaiting a response from provider.  Onita Pfluger,cma

## 2019-02-24 NOTE — Telephone Encounter (Signed)
lmtcb to schedule a virtual visit with provider or lauren guse.  Nina,cma

## 2019-02-25 ENCOUNTER — Ambulatory Visit (INDEPENDENT_AMBULATORY_CARE_PROVIDER_SITE_OTHER): Payer: Medicare Other

## 2019-02-25 ENCOUNTER — Other Ambulatory Visit: Payer: Self-pay

## 2019-02-25 ENCOUNTER — Ambulatory Visit (INDEPENDENT_AMBULATORY_CARE_PROVIDER_SITE_OTHER): Payer: Medicare Other | Admitting: Family Medicine

## 2019-02-25 ENCOUNTER — Encounter: Payer: Self-pay | Admitting: Family Medicine

## 2019-02-25 VITALS — BP 148/80 | HR 81 | Temp 98.1°F | Ht 58.27 in | Wt 138.6 lb

## 2019-02-25 DIAGNOSIS — R911 Solitary pulmonary nodule: Secondary | ICD-10-CM

## 2019-02-25 DIAGNOSIS — M25561 Pain in right knee: Secondary | ICD-10-CM | POA: Insufficient documentation

## 2019-02-25 DIAGNOSIS — M25861 Other specified joint disorders, right knee: Secondary | ICD-10-CM | POA: Diagnosis not present

## 2019-02-25 NOTE — Assessment & Plan Note (Signed)
Exam is concerning for a meniscal injury.  We will start with an x-ray and then likely proceed with MRI versus referral to orthopedics.  The patient will take over-the-counter Aleve totaling 440 mg every 12 hours for the next 4 to 5 days and then she will take this dosage every 12 hours as needed.  She will take this with food.  She is given return precautions.

## 2019-02-25 NOTE — Patient Instructions (Addendum)
Nice to see you. I believe you have likely done something to your meniscus in your right knee.  We will get an x-ray today.  We will likely then refer you to orthopedics. You can try taking Aleve 2 over-the-counter tablets every 12 hours as needed for pain.  You can try this for the next 5 or so days and see if that helps with your discomfort. If you have increasing swelling or pain or you develop warmth or redness or fevers you should be evaluated immediately.

## 2019-02-25 NOTE — Assessment & Plan Note (Signed)
I discussed with the patient the CT scan did reveal a number of benign-appearing lung nodules though there was one area that is concerning and needs to be followed as recommended by radiology.  Discussed that there is a small chance that this could represent a cancerous process though it has been stable and that is reassuring.
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
## 2019-03-05 ENCOUNTER — Telehealth: Payer: Self-pay | Admitting: Family Medicine

## 2019-03-05 DIAGNOSIS — M25561 Pain in right knee: Secondary | ICD-10-CM

## 2019-03-05 NOTE — Telephone Encounter (Signed)
Pt. Called back and given x-ray results. Verbalizes understanding. Request the referral for orthopedics as soon as possible. Still having a lot of pain and difficulty sleeping at night. Please advise pt.

## 2019-03-06 NOTE — Addendum Note (Signed)
Addended by: Leone Haven on: 03/06/2019 09:37 AM   Modules accepted: Orders

## 2019-03-06 NOTE — Telephone Encounter (Signed)
Called and informed the patient that her referral was placed and if she had more pain she could go to [facility].  Patient understood.  Nina,cma

## 2019-03-06 NOTE — Telephone Encounter (Signed)
Referral placed.  If her pain is still significant she could try being seen at the [facility] urgent clinic if they are currently open.  They are typically open between 1 and 7 though I am unsure if they are open to walk-in patients with the COVID-19 pandemic going on.  I will also send this to [facility] for her to send the referral.

## 2019-03-13 DIAGNOSIS — M25561 Pain in right knee: Secondary | ICD-10-CM | POA: Diagnosis not present

## 2019-03-26 ENCOUNTER — Ambulatory Visit: Payer: Medicare Other

## 2019-03-26 ENCOUNTER — Ambulatory Visit: Payer: Medicare Other | Admitting: Family Medicine

## 2019-03-27 ENCOUNTER — Other Ambulatory Visit: Payer: Self-pay

## 2019-03-27 ENCOUNTER — Ambulatory Visit (INDEPENDENT_AMBULATORY_CARE_PROVIDER_SITE_OTHER): Payer: Medicare Other

## 2019-03-27 VITALS — BP 140/73 | HR 82 | Temp 98.0°F | Ht <= 58 in | Wt 137.0 lb

## 2019-03-27 DIAGNOSIS — Z0000 Encounter for general adult medical examination without abnormal findings: Secondary | ICD-10-CM

## 2019-03-27 NOTE — Progress Notes (Signed)
Subjective:   Gail Pierce is a 85 y.o. female who presents for Medicare Annual (Subsequent) preventive examination.  Review of Systems:  No ROS.  Medicare Wellness Virtual Visit.  Visual/audio telehealth visit, UTA vital signs. Vital signs provided by patient.  See social history for additional risk factors.  Cardiac Risk Factors include: advanced age (>6455men, 55>65 women);hypertension     Objective:     Vitals: BP 140/73 (BP Location: Left Arm, Patient Position: Sitting, Cuff Size: Normal)   Pulse 82   Temp 98 F (36.7 C) (Oral)   Ht 4\' 10"  (1.473 m)   Wt 137 lb (62.1 kg)   BMI 28.63 kg/m   Body mass index is 28.63 kg/m.  Advanced Directives 03/27/2019 03/25/2018  Does Patient Have a Medical Advance Directive? Yes Yes  Type of Estate agentAdvance Directive Healthcare Power of [place];Living will Healthcare [place];Living will  Does patient want to make changes to medical advance directive? No - Patient declined No - Patient declined  Copy of Healthcare Power of Attorney in Chart? No - copy requested No - copy requested    Tobacco Social History   Tobacco Use  Smoking Status Former Smoker  Smokeless Tobacco Never Used     Counseling given: Not Answered   Clinical Intake:  Pre-visit preparation completed: Yes        Diabetes: No  How often do you need to have someone help you when you read instructions, pamphlets, or other written materials from your doctor or pharmacy?: 1 - Never  Interpreter Needed?: No     Past Medical History:  Diagnosis Date  . Allergy   . Hx of diverticulitis of colon   . Hypertension    Past Surgical History:  Procedure Laterality Date  . SEPTOPLASTY    . TONSILLECTOMY     Family History  Problem Relation Age of Onset  . Leukemia Mother   . Heart attack Father    Social History   Socioeconomic History  . Marital status: Widowed    Spouse name: Not on file  . Number of children: Not on file  . Years of education:  Not on file  . Highest education level: Not on file  Occupational History  . Not on file  Social Needs  . Financial resource strain: Not hard at all  . Food insecurity    Worry: Never true    Inability: Never true  . Transportation needs    Medical: No    Non-medical: No  Tobacco Use  . Smoking status: Former Games developermoker  . Smokeless tobacco: Never Used  Substance and Sexual Activity  . Alcohol use: Yes    Comment: OCC  . Drug use: No  . Sexual activity: Not on file  Lifestyle  . Physical activity    Days per week: 7 days    Minutes per session: 60 min  . Stress: Not at all  Relationships  . Social Musicianconnections    Talks on phone: Not on file    Gets together: Not on file    Attends religious service: Not on file    Active member of club or organization: Not on file    Attends meetings of clubs or organizations: Not on file    Relationship status: Not on file  Other Topics Concern  . Not on file  Social History Narrative  . Not on file    Outpatient Encounter Medications as of 03/27/2019  Medication Sig  . alendronate (FOSAMAX) 70 MG tablet  Take 1 tablet (70 mg total) by mouth every 7 (seven) days. Take with a full glass of water on an empty stomach.  Marland Kitchen amLODipine (NORVASC) 5 MG tablet Take 1 tablet (5 mg total) by mouth daily.  . Ascorbic Acid (VITAMIN C) 100 MG tablet Take 100 mg by mouth daily.  Marland Kitchen aspirin EC 81 MG tablet Take 81 mg by mouth daily.  . Calcium Carbonate-Vitamin D (CALCIUM 500/D PO) Take by mouth.  . Cholecalciferol (VITAMIN D3) 1000 units CAPS Take by mouth.  Marland Kitchen Corn Dextrin (EASY FIBER PO) Take by mouth.  . Cyanocobalamin 2500 MCG CHEW Chew by mouth.  . DHA-EPA-Flaxseed Oil-Vitamin E (THERA TEARS NUTRITION PO) Take by mouth.  . Glucosamine-Chondroit-Vit C-Mn (GLUCOSAMINE 1500 COMPLEX PO) Take by mouth.  . Multiple Vitamins-Minerals (CENTRUM ADULTS PO) Take by mouth.  . vitamin A 8000 UNIT capsule Take 8,000 Units by mouth daily.   No  facility-administered encounter medications on file as of 03/27/2019.     Activities of Daily Living In your present state of health, do you have any difficulty performing the following activities: 03/27/2019  Hearing? N  Vision? N  Difficulty concentrating or making decisions? N  Walking or climbing stairs? Y  Comment Notes she avoids stairs due to history of falls due to clumbsiness.  Dressing or bathing? N  Doing errands, shopping? N  Preparing Food and eating ? N  Using the Toilet? N  In the past six months, have you accidently leaked urine? N  Comment Managed with daily liner  Do you have problems with loss of bowel control? N  Managing your Medications? N  Managing your Finances? N  Housekeeping or managing your Housekeeping? N  Some recent data might be hidden    Patient Care Team: Leone Haven, MD as PCP - General (Family Medicine)    Assessment:   This is a routine wellness examination for Gail Pierce.  I connected with patient 03/27/19 at 10:30 AM EDT by an audio enabled telemedicine application and verified that I am speaking with the correct person using two identifiers. Patient stated full name and DOB. Patient gave permission to continue with virtual visit. Patient's location was at home and Nurse's location was at [place] office.   Health Screenings  Mammogram - 06/2018 Bone Density - 06/2018 Glaucoma -none Hearing -demonstrates normal hearing during visit. TSH- 07/2018 Vitamin D- 07/2018 Labs followed by pcp Dental- visits every 6 months Vision- visits within the last 12 months.  Social  Alcohol intake - yes      Smoking history- former    Smokers in home? none Illicit drug use? none Exercise - stretching, walking 1 mile daily, 60 minutes. Wears knee support when walking.  Diet - low cholesterol Sexually Active -not currently BMI- discussed the importance of a healthy diet, water intake and the benefits of aerobic exercise.  Educational material  provided.   Safety  Patient feels safe at home- yes Patient does have smoke detectors at home- yes Patient does wear sunscreen or protective clothing when in direct sunlight -yes Patient does wear seat belt when in a moving vehicle -yes Patient drives- yes Life alert- no  Covid-19 precautions and sickness symptoms discussed.   Activities of Daily Living Patient denies needing assistance with: driving, household chores, feeding themselves, getting from bed to chair, getting to the toilet, bathing/showering, dressing, managing money, or preparing meals.  No new identified risk were noted.    Depression Screen Patient denies losing interest in daily life, feeling  hopeless, or crying easily over simple problems.   Medication-taking as directed and without issues.   Fall Screen Patient denies being afraid of falling or falling in the last year.  Home free of loose throw rugs in walkways, electrical cords, etc- yes    Grab bars in the bathroom- yes  Handrails on the stairs- yes     Adequate lighting- yes    Memory Screen Patient is alert.  Patient denies difficulty focusing, concentrating or misplacing items. Correctly identified the president of the [place], season and recall. Patient likes to read and sew for brain stimulation.  Immunizations The following Immunizations were discussed: Influenza, shingles, pneumonia, and tetanus.   Other Providers Patient Care Team: Glori LuisSonnenberg, Eric G, MD as PCP - General (Family Medicine)  Exercise Activities and Dietary recommendations Current Exercise Habits: Home exercise routine, Type of exercise: walking;stretching, Time (Minutes): 60, Frequency (Times/Week): 7, Weekly Exercise (Minutes/Week): 420, Intensity: Mild  Goals      Patient Stated   . Increase physical activity (pt-stated)     Increase walking as tolerated and directed by physician       Fall Risk Fall Risk  03/27/2019 09/25/2018 08/05/2018 03/25/2018 03/23/2017  Falls in  the past year? 0 0 0 No No  Number falls in past yr: - 0 0 - -  Injury with Fall? - 0 0 - -   Depression Screen PHQ 2/9 Scores 03/27/2019 09/25/2018 08/05/2018 03/25/2018  PHQ - 2 Score 0 0 0 0  PHQ- 9 Score - - - -     Cognitive Function MMSE - Mini Mental State Exam 03/25/2018  Orientation to time 5  Orientation to Place 5  Registration 3  Attention/ Calculation 5  Recall 2  Language- name 2 objects 2  Language- repeat 1  Language- follow 3 step command 3  Language- read & follow direction 1  Write a sentence 1  Copy design 1  Total score 29     6CIT Screen 03/27/2019  What Year? 0 points  What month? 0 points  What time? 0 points  Count back from 20 0 points  Months in reverse 0 points  Repeat phrase 0 points  Total Score 0    Immunization History  Administered Date(s) Administered  . Influenza, High Dose Seasonal PF 05/28/2017, 06/10/2018  . Pneumococcal Conjugate-13 08/28/2011  . Pneumococcal Polysaccharide-23 12/31/2013  . Pneumococcal-Unspecified 08/28/2011  . Td 09/12/2015   Screening Tests Health Maintenance  Topic Date Due  . PNA vac Low Risk Adult (2 of 2 - PCV13) 03/26/2020 (Originally 01/01/2015)  . INFLUENZA VACCINE  04/12/2019  . TETANUS/TDAP  09/11/2025  . DEXA SCAN  Completed      Plan:    End of life planning; Advance aging; Advanced directives discussed.  Copy of current HCPOA/Living Will requested.    I have personally reviewed and noted the following in the patient's chart:   . Medical and social history . Use of alcohol, tobacco or illicit drugs  . Current medications and supplements . Functional ability and status . Nutritional status . Physical activity . Advanced directives . List of other physicians . Hospitalizations, surgeries, and ER visits in previous 12 months . Vitals . Screenings to include cognitive, depression, and falls . Referrals and appointments  In addition, I have reviewed and discussed with patient certain  preventive protocols, quality metrics, and best practice recommendations. A written personalized care plan for preventive services as well as general preventive health recommendations were provided to patient.  Ashok PallOBrien-Blaney, Brelynn Wheller L, LPN  [id number]/01027/16/2020

## 2019-03-27 NOTE — Patient Instructions (Addendum)
  Gail Pierce , Thank you for taking time to come for your Medicare Wellness Visit. I appreciate your ongoing commitment to your health goals. Please review the following plan we discussed and let me know if I can assist you in the future.   These are the goals we discussed: Goals      Patient Stated   . Increase physical activity (pt-stated)     Increase walking as tolerated and directed by physician       This is a list of the screening recommended for you and due dates:  Health Maintenance  Topic Date Due  . Pneumonia vaccines (2 of 2 - PCV13) 03/26/2020*  . Flu Shot  04/12/2019  . Tetanus Vaccine  09/11/2025  . DEXA scan (bone density measurement)  Completed  *Topic was postponed. The date shown is not the original due date.

## 2019-03-30 NOTE — Progress Notes (Signed)
I have reviewed the above note and agree.  Myleigh Amara, M.D.  

## 2019-04-10 DIAGNOSIS — M25561 Pain in right knee: Secondary | ICD-10-CM | POA: Diagnosis not present

## 2019-05-09 ENCOUNTER — Other Ambulatory Visit: Payer: Self-pay | Admitting: Family Medicine

## 2019-05-09 DIAGNOSIS — Z1231 Encounter for screening mammogram for malignant neoplasm of breast: Secondary | ICD-10-CM

## 2019-05-21 ENCOUNTER — Telehealth: Payer: Self-pay | Admitting: *Deleted

## 2019-05-21 NOTE — Telephone Encounter (Signed)
Referral received for pt to be seen in [facility] for further workup of incidental lung nodule with follow up CT scan. Left message with patient to call back to discuss clinic and review recommendations and upcoming appts including follow up CT scan and visit with Jenny Burns, NP to discuss results. Awaiting call back.  

## 2019-05-23 ENCOUNTER — Telehealth: Payer: Self-pay | Admitting: *Deleted

## 2019-05-23 NOTE — Telephone Encounter (Signed)
Pt made aware of referral to [facility] from PCP. Pt is in agreement to have upcoming CT scan and follow up as recommended. Pt has been made aware of upcoming appts for follow up CT scan and follow up appt with Faythe Casa, NP in the [facility]. Pt verbalized understanding. Nothing further needed at this time. Appts have been mailed to patient with contact info if pt has any further questions or needs.

## 2019-06-16 ENCOUNTER — Other Ambulatory Visit: Payer: Self-pay

## 2019-06-16 ENCOUNTER — Ambulatory Visit
Admission: RE | Admit: 2019-06-16 | Discharge: 2019-06-16 | Disposition: A | Discharge status: Home / Self Care | Payer: Medicare Other | Source: Ambulatory Visit | Attending: Family Medicine | Admitting: Family Medicine

## 2019-06-16 DIAGNOSIS — Z20822 Contact with and (suspected) exposure to covid-19: Secondary | ICD-10-CM

## 2019-06-16 DIAGNOSIS — Z1231 Encounter for screening mammogram for malignant neoplasm of breast: Secondary | ICD-10-CM | POA: Insufficient documentation

## 2019-06-18 LAB — NOVEL CORONAVIRUS, NAA: SARS-CoV-2, NAA: NOT DETECTED

## 2019-06-30 ENCOUNTER — Ambulatory Visit (INDEPENDENT_AMBULATORY_CARE_PROVIDER_SITE_OTHER): Payer: Medicare Other | Admitting: Family Medicine

## 2019-06-30 ENCOUNTER — Encounter: Payer: Self-pay | Admitting: Family Medicine

## 2019-06-30 ENCOUNTER — Other Ambulatory Visit: Payer: Self-pay

## 2019-06-30 VITALS — Ht 59.0 in | Wt 150.0 lb

## 2019-06-30 DIAGNOSIS — I10 Essential (primary) hypertension: Secondary | ICD-10-CM

## 2019-06-30 DIAGNOSIS — E669 Obesity, unspecified: Secondary | ICD-10-CM | POA: Diagnosis not present

## 2019-06-30 DIAGNOSIS — M8580 Other specified disorders of bone density and structure, unspecified site: Secondary | ICD-10-CM

## 2019-06-30 DIAGNOSIS — M25561 Pain in right knee: Secondary | ICD-10-CM

## 2019-06-30 NOTE — Assessment & Plan Note (Signed)
Resolved.  She will monitor for any recurrence. 

## 2019-06-30 NOTE — Assessment & Plan Note (Signed)
Adequately controlled for age.  Continue amlodipine.  She will come in for labs in a month.

## 2019-06-30 NOTE — Assessment & Plan Note (Signed)
Encouraged continued exercise and monitoring her diet.

## 2019-06-30 NOTE — Progress Notes (Signed)
Virtual Visit via video Note  This visit type was conducted due to national recommendations for restrictions regarding the COVID-19 pandemic (e.g. social distancing).  This format is felt to be most appropriate for this patient at this time.  All issues noted in this document were discussed and addressed.  No physical exam was performed (except for noted visual exam findings with Video Visits).   I connected with Gail Pierce today at  9:30 AM EDT by a video enabled telemedicine application or telephone and verified that I am speaking with the correct person using two identifiers. Location patient: car Location provider: work Persons participating in the virtual visit: patient, provider  I discussed the limitations, risks, security and privacy concerns of performing an evaluation and management service by telephone and the availability of in person appointments. I also discussed with the patient that there may be a patient responsible charge related to this service. The patient expressed understanding and agreed to proceed.   Reason for visit: follow-up  HPI: HYPERTENSION  Disease Monitoring  Home BP Monitoring 145/72 Chest pain- no    Dyspnea- no Medications  Compliance-  Taking amlodpine.  Edema- no  Right knee pain: Patient notes this has resolved.  She saw the orthopedist and they placed her in a brace for several weeks.  She notes the pain and swelling resolved after that.  It has not recurred.  Obesity: She does not limit her self on her diet.  She rarely has soda.  Rarely drinks alcohol.  She walks most days if the weather is good.  She does an exercise video Monday through Friday.  COVID-19 test: Patient got tested on 06/16/2019 and was negative.  She had no symptoms or exposures.  She just wanted to get tested as she was over there getting her mammogram completed.     ROS: See pertinent positives and negatives per HPI.  Past Medical History:  Diagnosis Date  . Allergy    . Hx of diverticulitis of colon   . Hypertension     Past Surgical History:  Procedure Laterality Date  . BREAST BIOPSY Left 1980   benign  . SEPTOPLASTY    . TONSILLECTOMY      Family History  Problem Relation Age of Onset  . Leukemia Mother   . Heart attack Father     SOCIAL HX: former smoker   Current Outpatient Medications:  .  alendronate (FOSAMAX) 70 MG tablet, Take 1 tablet (70 mg total) by mouth every 7 (seven) days. Take with a full glass of water on an empty stomach., Disp: 4 tablet, Rfl: 11 .  amLODipine (NORVASC) 5 MG tablet, Take 1 tablet (5 mg total) by mouth daily., Disp: 90 tablet, Rfl: 3 .  Ascorbic Acid (VITAMIN C) 100 MG tablet, Take 100 mg by mouth daily., Disp: , Rfl:  .  aspirin EC 81 MG tablet, Take 81 mg by mouth daily., Disp: , Rfl:  .  Calcium Carbonate-Vitamin D (CALCIUM 500/D PO), Take by mouth., Disp: , Rfl:  .  Cholecalciferol (VITAMIN D3) 1000 units CAPS, Take by mouth., Disp: , Rfl:  .  Corn Dextrin (EASY FIBER PO), Take by mouth., Disp: , Rfl:  .  Cyanocobalamin 2500 MCG CHEW, Chew by mouth., Disp: , Rfl:  .  DHA-EPA-Flaxseed Oil-Vitamin E (THERA TEARS NUTRITION PO), Take by mouth., Disp: , Rfl:  .  Glucosamine-Chondroit-Vit C-Mn (GLUCOSAMINE 1500 COMPLEX PO), Take by mouth., Disp: , Rfl:  .  Multiple Vitamins-Minerals (CENTRUM ADULTS PO), Take  by mouth., Disp: , Rfl:  .  vitamin A 8000 UNIT capsule, Take 8,000 Units by mouth daily., Disp: , Rfl:   EXAM:  VITALS per patient if applicable: none  GENERAL: alert, oriented, appears well and in no acute distress  HEENT: atraumatic, conjunttiva clear, no obvious abnormalities on inspection of external nose and ears  NECK: normal movements of the head and neck  LUNGS: on inspection no signs of respiratory distress, breathing rate appears normal, no obvious gross SOB, gasping or wheezing  CV: no obvious cyanosis  MS: moves all visible extremities without noticeable abnormality  PSYCH/NEURO:  pleasant and cooperative, no obvious depression or anxiety, speech and thought processing grossly intact  ASSESSMENT AND PLAN:  Discussed the following assessment and plan:  Hypertension Adequately controlled for age.  Continue amlodipine.  She will come in for labs in a month.  Acute pain of right knee Resolved.  She will monitor for any recurrence.  Obesity (BMI 30.0-34.9) Encouraged continued exercise and monitoring her diet.  COVID-19 test: Discussed the patient would have to wait at least 21 days from the day of her test prior to coming in for labs or being seen in the office.    I discussed the assessment and treatment plan with the patient. The patient was provided an opportunity to ask questions and all were answered. The patient agreed with the plan and demonstrated an understanding of the instructions.   The patient was advised to call back or seek an in-person evaluation if the symptoms worsen or if the condition fails to improve as anticipated.    Marikay Alar, MD

## 2019-07-25 DIAGNOSIS — H35372 Puckering of macula, left eye: Secondary | ICD-10-CM | POA: Diagnosis not present

## 2019-08-01 ENCOUNTER — Other Ambulatory Visit: Payer: Self-pay

## 2019-08-01 ENCOUNTER — Other Ambulatory Visit (INDEPENDENT_AMBULATORY_CARE_PROVIDER_SITE_OTHER): Payer: Medicare Other

## 2019-08-01 DIAGNOSIS — M8580 Other specified disorders of bone density and structure, unspecified site: Secondary | ICD-10-CM | POA: Diagnosis not present

## 2019-08-01 DIAGNOSIS — I10 Essential (primary) hypertension: Secondary | ICD-10-CM | POA: Diagnosis not present

## 2019-08-01 LAB — COMPREHENSIVE METABOLIC PANEL
ALT: 13 U/L (ref 0–35)
AST: 17 U/L (ref 0–37)
Albumin: 4.4 g/dL (ref 3.5–5.2)
Alkaline Phosphatase: 64 U/L (ref 39–117)
BUN: 15 mg/dL (ref 6–23)
CO2: 27 mEq/L (ref 19–32)
Calcium: 10.4 mg/dL (ref 8.4–10.5)
Chloride: 102 mEq/L (ref 96–112)
Creatinine, Ser: 0.76 mg/dL (ref 0.40–1.20)
GFR: 72.16 mL/min (ref >60.00)
Glucose, Bld: 102 mg/dL — ABNORMAL HIGH (ref 70–99)
Potassium: 4.5 mEq/L (ref 3.5–5.1)
Sodium: 138 mEq/L (ref 135–145)
Total Bilirubin: 0.7 mg/dL (ref 0.2–1.2)
Total Protein: 6.8 g/dL (ref 6.0–8.3)

## 2019-08-01 LAB — VITAMIN D 25 HYDROXY (VIT D DEFICIENCY, FRACTURES): VITD: 99.56 ng/mL (ref 30.00–100.00)

## 2019-08-13 DIAGNOSIS — M545 Low back pain: Secondary | ICD-10-CM | POA: Diagnosis not present

## 2019-08-13 DIAGNOSIS — M5136 Other intervertebral disc degeneration, lumbar region: Secondary | ICD-10-CM | POA: Diagnosis not present

## 2019-08-14 DIAGNOSIS — M545 Low back pain: Secondary | ICD-10-CM | POA: Diagnosis not present

## 2019-08-14 DIAGNOSIS — M5416 Radiculopathy, lumbar region: Secondary | ICD-10-CM | POA: Diagnosis not present

## 2019-08-22 DIAGNOSIS — M5416 Radiculopathy, lumbar region: Secondary | ICD-10-CM | POA: Diagnosis not present

## 2019-08-22 DIAGNOSIS — M545 Low back pain: Secondary | ICD-10-CM | POA: Diagnosis not present

## 2019-08-26 DIAGNOSIS — M545 Low back pain: Secondary | ICD-10-CM | POA: Diagnosis not present

## 2019-08-26 DIAGNOSIS — M5416 Radiculopathy, lumbar region: Secondary | ICD-10-CM | POA: Diagnosis not present

## 2019-08-29 DIAGNOSIS — M5416 Radiculopathy, lumbar region: Secondary | ICD-10-CM | POA: Diagnosis not present

## 2019-08-29 DIAGNOSIS — M545 Low back pain: Secondary | ICD-10-CM | POA: Diagnosis not present

## 2019-09-02 DIAGNOSIS — M5416 Radiculopathy, lumbar region: Secondary | ICD-10-CM | POA: Diagnosis not present

## 2019-09-02 DIAGNOSIS — M545 Low back pain: Secondary | ICD-10-CM | POA: Diagnosis not present

## 2019-09-02 DIAGNOSIS — M47896 Other spondylosis, lumbar region: Secondary | ICD-10-CM | POA: Diagnosis not present

## 2019-09-03 DIAGNOSIS — M545 Low back pain: Secondary | ICD-10-CM | POA: Diagnosis not present

## 2019-09-10 DIAGNOSIS — S92912A Unspecified fracture of left toe(s), initial encounter for closed fracture: Secondary | ICD-10-CM | POA: Diagnosis not present

## 2019-09-10 DIAGNOSIS — S92353A Displaced fracture of fifth metatarsal bone, unspecified foot, initial encounter for closed fracture: Secondary | ICD-10-CM | POA: Insufficient documentation

## 2019-09-10 DIAGNOSIS — S92352A Displaced fracture of fifth metatarsal bone, left foot, initial encounter for closed fracture: Secondary | ICD-10-CM | POA: Diagnosis not present

## 2019-09-10 DIAGNOSIS — M5416 Radiculopathy, lumbar region: Secondary | ICD-10-CM | POA: Diagnosis not present

## 2019-09-15 ENCOUNTER — Encounter: Payer: Self-pay | Admitting: Family Medicine

## 2019-09-15 ENCOUNTER — Other Ambulatory Visit: Payer: Self-pay

## 2019-09-15 ENCOUNTER — Ambulatory Visit (INDEPENDENT_AMBULATORY_CARE_PROVIDER_SITE_OTHER): Payer: Medicare Other | Admitting: Family Medicine

## 2019-09-15 VITALS — Ht 59.0 in | Wt 149.0 lb

## 2019-09-15 DIAGNOSIS — S060X9A Concussion with loss of consciousness of unspecified duration, initial encounter: Secondary | ICD-10-CM | POA: Diagnosis not present

## 2019-09-15 DIAGNOSIS — S0990XA Unspecified injury of head, initial encounter: Secondary | ICD-10-CM | POA: Diagnosis not present

## 2019-09-15 DIAGNOSIS — W19XXXA Unspecified fall, initial encounter: Secondary | ICD-10-CM

## 2019-09-15 NOTE — Progress Notes (Signed)
Virtual Visit via telephone Note  This visit type was conducted due to national recommendations for restrictions regarding the COVID-19 pandemic (e.g. social distancing).  This format is felt to be most appropriate for this patient at this time.  All issues noted in this document were discussed and addressed.  No physical exam was performed (except for noted visual exam findings with Video Visits).   I connected with Gail Pierce today at 10:00 AM EST by telephone and verified that I am speaking with the correct person using two identifiers. Location patient: home Location provider: work  Persons participating in the virtual visit: patient, provider  I discussed the limitations, risks, security and privacy concerns of performing an evaluation and management service by telephone and the availability of in person appointments. I also discussed with the patient that there may be a patient responsible charge related to this service. The patient expressed understanding and agreed to proceed.  Interactive audio and video telecommunications were attempted between this provider and patient, however failed, due to patient having technical difficulties OR patient did not have access to video capability.  We continued and completed visit with audio only.   Reason for visit: same day visit  HPI: Falls: Patient notes 2 falls.  The first 1 occurred at the end of October and the second 1 occurred just after Thanksgiving.  In October she fell getting up from a chair.  She hurt her feet with this fall and fractured her fifth metatarsal and proximal phalanx.  She is in a hard shoe and is following with the appropriate specialist..  The second fall she fell getting into the bathroom.  She slipped on the carpet.  She cannot remember falling and she believes she lost consciousness.  She did hit the right side of her head.  She has had no recurrent headaches.  No numbness or weakness.  She had 1 day where she had  trouble focusing in her right eye though otherwise her vision has been normal.  She did have a little bit of trouble remembering the time course of when these falls occurred and it took repeated questioning to determine the appropriate timeline of her falls.   ROS: See pertinent positives and negatives per HPI.  Past Medical History:  Diagnosis Date  . Allergy   . Hx of diverticulitis of colon   . Hypertension     Past Surgical History:  Procedure Laterality Date  . BREAST BIOPSY Left 1980   benign  . SEPTOPLASTY    . TONSILLECTOMY      Family History  Problem Relation Age of Onset  . Leukemia Mother   . Heart attack Father     SOCIAL HX: Former smoker   Current Outpatient Medications:  .  alendronate (FOSAMAX) 70 MG tablet, Take 1 tablet (70 mg total) by mouth every 7 (seven) days. Take with a full glass of water on an empty stomach., Disp: 4 tablet, Rfl: 11 .  amLODipine (NORVASC) 5 MG tablet, Take 1 tablet (5 mg total) by mouth daily., Disp: 90 tablet, Rfl: 3 .  Ascorbic Acid (VITAMIN C) 100 MG tablet, Take 100 mg by mouth daily., Disp: , Rfl:  .  aspirin EC 81 MG tablet, Take 81 mg by mouth daily., Disp: , Rfl:  .  Calcium Carbonate-Vitamin D (CALCIUM 500/D PO), Take by mouth., Disp: , Rfl:  .  Cholecalciferol (VITAMIN D3) 1000 units CAPS, Take by mouth., Disp: , Rfl:  .  ciclopirox (PENLAC) 8 % solution,  ciclopirox 8 % topical solution  APPLY TOPICALLY NIGHTLY OVER NAIL AND SURROUNDING SKIN., Disp: , Rfl:  .  Corn Dextrin (EASY FIBER PO), Take by mouth., Disp: , Rfl:  .  Cyanocobalamin 2500 MCG CHEW, Chew by mouth., Disp: , Rfl:  .  DHA-EPA-Flaxseed Oil-Vitamin E (THERA TEARS NUTRITION PO), Take by mouth., Disp: , Rfl:  .  Glucosamine-Chondroit-Vit C-Mn (GLUCOSAMINE 1500 COMPLEX PO), Take by mouth., Disp: , Rfl:  .  Influenza Vac High-Dose Quad (FLUZONE HIGH-DOSE QUADRIVALENT) 0.7 ML SUSY, Fluzone High-Dose Quad 2020-21 (PF) 240 mcg/0.7 mL IM syringe, Disp: , Rfl:  .   Multiple Vitamins-Minerals (CENTRUM ADULTS PO), Take by mouth., Disp: , Rfl:  .  vitamin A 8000 UNIT capsule, Take 8,000 Units by mouth daily., Disp: , Rfl:   EXAM: This was a telehealth telephone visit and thus no physical exam was completed.   ASSESSMENT AND PLAN:  Discussed the following assessment and plan:  Falls New onset issue.  Advised of falls precautions.  Given second fall occurred with a head injury and possible loss of consciousness we will proceed with CT head to rule out any underlying injuries though given how far out she is it would be unlikely to find anything significant.  She will continue to see her specialist regarding her foot.   Orders Placed This Encounter  Procedures  . CT Head Wo Contrast    Patient to wait until report called    Standing Status:   Future    Number of Occurrences:   1    Standing Expiration Date:   12/13/2020    Order Specific Question:   ** REASON FOR EXAM (FREE TEXT)    Answer:   head injury with fall, possible recent vision changes, difficult to get time course from patient though fall was possibly 1 month ago with LOC    Order Specific Question:   Preferred imaging location?    Answer:   [facility]    Order Specific Question:   Call Results- Best Contact Number?    Answer:   [phone]    Order Specific Question:   Radiology Contrast Protocol - do NOT remove file path    Answer:   [email or web address]    No orders of the defined types were placed in this encounter.    I discussed the assessment and treatment plan with the patient. The patient was provided an opportunity to ask questions and all were answered. The patient agreed with the plan and demonstrated an understanding of the instructions.   The patient was advised to call back or seek an in-person evaluation if the symptoms worsen or if the condition fails to improve as anticipated.  I provided 24 minutes of non-face-to-face time during this  encounter.   Marikay Alar, MD

## 2019-09-16 ENCOUNTER — Ambulatory Visit
Admission: RE | Admit: 2019-09-16 | Discharge: 2019-09-16 | Disposition: A | Discharge status: Home / Self Care | Payer: Medicare Other | Source: Ambulatory Visit | Attending: Family Medicine | Admitting: Family Medicine

## 2019-09-16 ENCOUNTER — Telehealth: Payer: Self-pay

## 2019-09-16 DIAGNOSIS — S0990XA Unspecified injury of head, initial encounter: Secondary | ICD-10-CM | POA: Diagnosis not present

## 2019-09-16 DIAGNOSIS — S060X9A Concussion with loss of consciousness of unspecified duration, initial encounter: Secondary | ICD-10-CM | POA: Diagnosis not present

## 2019-09-16 NOTE — Telephone Encounter (Signed)
Please let the patient know that her CT scan did not reveal any acute changes.  There is no evidence of a brain bleed.  She does have chronic microvascular changes which are somewhat commonly seen in her age group and are likely related to a history of blood pressure or cholesterol issues.  Thanks.

## 2019-09-16 NOTE — Telephone Encounter (Signed)
I called and informed the patient of her CT results.  Patient understood.  Lovey Crupi,cma

## 2019-09-17 DIAGNOSIS — M47817 Spondylosis without myelopathy or radiculopathy, lumbosacral region: Secondary | ICD-10-CM | POA: Diagnosis not present

## 2019-09-17 DIAGNOSIS — M4317 Spondylolisthesis, lumbosacral region: Secondary | ICD-10-CM | POA: Diagnosis not present

## 2019-09-18 NOTE — Assessment & Plan Note (Signed)
New onset issue.  Advised of falls precautions.  Given second fall occurred with a head injury and possible loss of consciousness we will proceed with CT head to rule out any underlying injuries though given how far out she is it would be unlikely to find anything significant.  She will continue to see her specialist regarding her foot.

## 2019-09-24 DIAGNOSIS — S92352A Displaced fracture of fifth metatarsal bone, left foot, initial encounter for closed fracture: Secondary | ICD-10-CM | POA: Diagnosis not present

## 2019-10-10 ENCOUNTER — Ambulatory Visit: Payer: Self-pay

## 2019-10-17 ENCOUNTER — Ambulatory Visit: Payer: Medicare Other | Attending: Internal Medicine

## 2019-10-17 ENCOUNTER — Ambulatory Visit: Payer: Medicare Other

## 2019-10-17 DIAGNOSIS — Z23 Encounter for immunization: Secondary | ICD-10-CM | POA: Insufficient documentation

## 2019-10-17 NOTE — Progress Notes (Signed)
   Covid-19 Vaccination Clinic  Name:  Gail Pierce    MRN: [id number] DOB: 17-Aug-1931  10/17/2019  [name] was observed post Covid-19 immunization for 15 minutes without incidence. She was provided with Vaccine Information Sheet and instruction to access the V-Safe system.   [name] was instructed to call 911 with any severe reactions post vaccine: Marland Kitchen Difficulty breathing  . Swelling of your face and throat  . A fast heartbeat  . A bad rash all over your body  . Dizziness and weakness    Immunizations Administered    Name Date Dose VIS Date Route   Pfizer COVID-19 Vaccine 10/17/2019 11:11 AM 0.3 mL 08/22/2019 Intramuscular   Manufacturer: [employer], [employer]   Lot: [id number]   NDC: [address]-1

## 2019-10-22 DIAGNOSIS — S92355A Nondisplaced fracture of fifth metatarsal bone, left foot, initial encounter for closed fracture: Secondary | ICD-10-CM | POA: Diagnosis not present

## 2019-10-31 ENCOUNTER — Ambulatory Visit: Payer: Self-pay

## 2019-11-11 ENCOUNTER — Ambulatory Visit: Payer: Medicare Other | Attending: Internal Medicine

## 2019-11-11 DIAGNOSIS — Z23 Encounter for immunization: Secondary | ICD-10-CM | POA: Insufficient documentation

## 2019-11-11 NOTE — Progress Notes (Signed)
   Covid-19 Vaccination Clinic  Name:  Katurah Karapetian    MRN: [id number] DOB: 1930/10/04  11/11/2019  Ms. Stavely was observed post Covid-19 immunization for 15 minutes without incident. She was provided with Vaccine Information Sheet and instruction to access the V-Safe system.   Ms. Toutant was instructed to call 911 with any severe reactions post vaccine: Marland Kitchen Difficulty breathing  . Swelling of face and throat  . A fast heartbeat  . A bad rash all over body  . Dizziness and weakness   Immunizations Administered    Name Date Dose VIS Date Route   Pfizer COVID-19 Vaccine 11/11/2019 11:59 AM 0.3 mL 08/22/2019 Intramuscular   Manufacturer: [employer], [employer]   Lot: [id number]   NDC: [address]-0

## 2019-11-28 DIAGNOSIS — S92355A Nondisplaced fracture of fifth metatarsal bone, left foot, initial encounter for closed fracture: Secondary | ICD-10-CM | POA: Diagnosis not present

## 2020-02-02 ENCOUNTER — Other Ambulatory Visit: Payer: Self-pay

## 2020-02-02 ENCOUNTER — Ambulatory Visit
Admission: RE | Admit: 2020-02-02 | Discharge: 2020-02-02 | Disposition: A | Discharge status: Home / Self Care | Payer: Medicare Other | Source: Ambulatory Visit | Attending: Oncology | Admitting: Oncology

## 2020-02-02 DIAGNOSIS — R911 Solitary pulmonary nodule: Secondary | ICD-10-CM | POA: Diagnosis not present

## 2020-02-03 ENCOUNTER — Inpatient Hospital Stay: Payer: Medicare Other | Attending: Oncology | Admitting: Oncology

## 2020-02-03 DIAGNOSIS — R911 Solitary pulmonary nodule: Secondary | ICD-10-CM | POA: Diagnosis not present

## 2020-02-03 DIAGNOSIS — R918 Other nonspecific abnormal finding of lung field: Secondary | ICD-10-CM | POA: Insufficient documentation

## 2020-02-03 NOTE — Progress Notes (Signed)
Pulmonary Nodule Clinic Consult note [facility]  [phone] [phone]  Patient Care Team: Glori Luis, MD as PCP - General (Family Medicine)   Name of the patient: Gail Pierce  [id number]  28-Feb-1931   Date of visit: 02/03/2020   Diagnosis- Lung Nodule   Chief complaint/ Reason for visit- Pulmonary Nodule Clinic Initial Visit  Past Medical History:  Patient is managed/referred by PCP Dr. Birdie Sons.  Most recent CT scan completed on 01/30/2019 revealed stable bilateral pulmonary nodules but new groundglass opacity in right lower lobe measuring approximately 7 mm.  Adenocarcinoma cannot be excluded.  Previous imaging with CT chest without contrast on 10/10/2017 revealed small bilateral lung nodules including 6 nodules in the left upper lobe, largest measuring 4 mm in size and a 2 mm right middle lobe lung nodule.  Per Fleischner guidelines, CT scan without contrast is recommended in 12 months and if stable repeat CT chest at 18 to 24 months to ensure stability.  Interval history-patient presents today to review most recent CT scan.  Mrs. Bosch is a current non-smoker.  She quit approximately 18 years ago.  She smoked for about 10 years.  She does not remember how many cigarettes she smokes daily but it was less than half a pack.  Her husband was a non-smoker so she did not smoke around him.  She is from [place] and moved to [place] when she was 86 years old.  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  She denies any known exposure to toxins known to cause cancer.  She is widowed.  She lives here locally.  She has a daughter and 2 granddaughters that live close by.  She denies any personal history of cancer.  She denies any family history of cancer.  She is followed closely by her PCP Dr. Birdie Sons.  She was evaluated back in January 2021 for recurrent falls with injury.  She noted injury to her foot and  fifth metatarsal of right hand.  She also notes possible loss of consciousness and injury to the  right side of her head.  CT head from 09/16/2019 was negative for acute abnormality.  She denies any recent falls.  Today, she is doing well.  She denies any respiratory concerns.  She is healthy.  She denies any recent fever or illness.  Her appetite is good and she denies any weight loss.  She denies any chest pain, nausea, vomiting, constipation or diarrhea.  ECOG FS:0 - Asymptomatic  Review of systems- Review of Systems  Constitutional: Negative.  Negative for chills, fever, malaise/fatigue and weight loss.  HENT: Negative for congestion, ear pain and tinnitus.   Eyes: Negative.  Negative for blurred vision and double vision.  Respiratory: Negative.  Negative for cough, sputum production and shortness of breath.   Cardiovascular: Negative.  Negative for chest pain, palpitations and leg swelling.  Gastrointestinal: Negative.  Negative for abdominal pain, constipation, diarrhea, nausea and vomiting.  Genitourinary: Negative for dysuria, frequency and urgency.  Musculoskeletal: Negative for back pain and falls.  Skin: Negative.  Negative for rash.  Neurological: Negative.  Negative for weakness and headaches.  Endo/Heme/Allergies: Negative.  Does not bruise/bleed easily.  Psychiatric/Behavioral: Negative.  Negative for depression. The patient is not nervous/anxious and does not have insomnia.      No Known Allergies   Past Medical History:  Diagnosis Date  . Allergy   . Hx of diverticulitis of colon   . Hypertension  Past Surgical History:  Procedure Laterality Date  . BREAST BIOPSY Left 1980   benign  . SEPTOPLASTY    . TONSILLECTOMY      Social History   Socioeconomic History  . Marital status: Widowed    Spouse name: Not on file  . Number of children: Not on file  . Years of education: Not on file  . Highest education level: Not on file  Occupational History  . Not  on file  Tobacco Use  . Smoking status: Former Games developer  . Smokeless tobacco: Never Used  Substance and Sexual Activity  . Alcohol use: Yes    Comment: OCC  . Drug use: No  . Sexual activity: Not on file  Other Topics Concern  . Not on file  Social History Narrative  . Not on file   Social Determinants of Health   Financial Resource Strain:   . Difficulty of Paying Living Expenses:   Food Insecurity:   . Worried About Programme researcher, broadcasting/film/video in the Last Year:   . Barista in the Last Year:   Transportation Needs:   . Freight forwarder (Medical):   Marland Kitchen Lack of Transportation (Non-Medical):   Physical Activity:   . Days of Exercise per Week:   . Minutes of Exercise per Session:   Stress:   . Feeling of Stress :   Social Connections:   . Frequency of Communication with Friends and Family:   . Frequency of Social Gatherings with Friends and Family:   . Attends Religious Services:   . Active Member of Clubs or Organizations:   . Attends Banker Meetings:   Marland Kitchen Marital Status:   Intimate Partner Violence:   . Fear of Current or Ex-Partner:   . Emotionally Abused:   Marland Kitchen Physically Abused:   . Sexually Abused:     Family History  Problem Relation Age of Onset  . Leukemia Mother   . Heart attack Father      Current Outpatient Medications:  .  alendronate (FOSAMAX) 70 MG tablet, Take 1 tablet (70 mg total) by mouth every 7 (seven) days. Take with a full glass of water on an empty stomach., Disp: 4 tablet, Rfl: 11 .  amLODipine (NORVASC) 5 MG tablet, Take 1 tablet (5 mg total) by mouth daily., Disp: 90 tablet, Rfl: 3 .  Ascorbic Acid (VITAMIN C) 100 MG tablet, Take 100 mg by mouth daily., Disp: , Rfl:  .  aspirin EC 81 MG tablet, Take 81 mg by mouth daily., Disp: , Rfl:  .  Calcium Carbonate-Vitamin D (CALCIUM 500/D PO), Take by mouth., Disp: , Rfl:  .  Cholecalciferol (VITAMIN D3) 1000 units CAPS, Take by mouth., Disp: , Rfl:  .  ciclopirox (PENLAC) 8 %  solution, ciclopirox 8 % topical solution  APPLY TOPICALLY NIGHTLY OVER NAIL AND SURROUNDING SKIN., Disp: , Rfl:  .  Corn Dextrin (EASY FIBER PO), Take by mouth., Disp: , Rfl:  .  Cyanocobalamin 2500 MCG CHEW, Chew by mouth., Disp: , Rfl:  .  DHA-EPA-Flaxseed Oil-Vitamin E (THERA TEARS NUTRITION PO), Take by mouth., Disp: , Rfl:  .  Glucosamine-Chondroit-Vit C-Mn (GLUCOSAMINE 1500 COMPLEX PO), Take by mouth., Disp: , Rfl:  .  Influenza Vac High-Dose Quad (FLUZONE HIGH-DOSE QUADRIVALENT) 0.7 ML SUSY, Fluzone High-Dose Quad 2020-21 (PF) 240 mcg/0.7 mL IM syringe, Disp: , Rfl:  .  Multiple Vitamins-Minerals (CENTRUM ADULTS PO), Take by mouth., Disp: , Rfl:  .  vitamin A 8000 UNIT  capsule, Take 8,000 Units by mouth daily., Disp: , Rfl:   Physical exam: There were no vitals filed for this visit. Physical Exam Constitutional:      Appearance: Normal appearance.  HENT:     Head: Normocephalic and atraumatic.  Eyes:     Pupils: Pupils are equal, round, and reactive to light.  Cardiovascular:     Rate and Rhythm: Normal rate and regular rhythm.     Heart sounds: Normal heart sounds. No murmur.  Pulmonary:     Effort: Pulmonary effort is normal.     Breath sounds: Normal breath sounds. No wheezing.  Abdominal:     General: Bowel sounds are normal. There is no distension.     Palpations: Abdomen is soft.     Tenderness: There is no abdominal tenderness.  Musculoskeletal:        General: Normal range of motion.     Cervical back: Normal range of motion.  Skin:    General: Skin is warm and dry.     Findings: No rash.  Neurological:     Mental Status: She is alert and oriented to person, place, and time.  Psychiatric:        Judgment: Judgment normal.      CMP Latest Ref Rng & Units 08/01/2019  Glucose 70 - 99 mg/dL 132(G102(H)  BUN 6 - 23 mg/dL 15  Creatinine 4.010.40 - 0.271.20 mg/dL 2.530.76  Sodium 664135 - 403145 mEq/L 138  Potassium 3.5 - 5.1 mEq/L 4.5  Chloride 96 - 112 mEq/L 102  CO2 19 - 32 mEq/L  27  Calcium 8.4 - 10.5 mg/dL 47.410.4  Total Protein 6.0 - 8.3 g/dL 6.8  Total Bilirubin 0.2 - 1.2 mg/dL 0.7  Alkaline Phos 39 - 117 U/L 64  AST 0 - 37 U/L 17  ALT 0 - 35 U/L 13   CBC Latest Ref Rng & Units 08/05/2018  WBC 4.0 - 10.5 K/uL 5.9  Hemoglobin 12.0 - 15.0 g/dL 25.913.2  Hematocrit 56.336.0 - 46.0 % 39.1  Platelets 150.0 - 400.0 K/uL 226.0    No images are attached to the encounter.  CT Chest Wo Contrast  Result Date: 02/02/2020 CLINICAL DATA:  Follow-up pulmonary nodule EXAM: CT CHEST WITHOUT CONTRAST TECHNIQUE: Multidetector CT imaging of the chest was performed following the standard protocol without IV contrast. COMPARISON:  CT chest dated 01/30/2019 FINDINGS: Cardiovascular: Mild cardiomegaly.  No pericardial effusion. No evidence of thoracic aortic aneurysm. Atherosclerotic calcifications of the aortic arch. Mediastinum/Nodes: No suspicious mediastinal lymphadenopathy. Stable 11 mm left thyroid nodule (series 2/image 22). Not clinically significant; no follow-up imaging recommended (ref: J Am Coll Radiol. 2015 Feb;12(2): 143-50). Lungs/Pleura: Mild biapical pleural-parenchymal scarring. Mild centrilobular emphysematous changes, upper lung predominant. Scattered small calcified and noncalcified pulmonary nodules measuring up to 5 mm in the left upper lobe, unchanged from the prior, benign. 4 x 7 mm subsolid nodule in the posterior right lower lobe (series 3/image 109), unchanged. No new/suspicious pulmonary nodules. No focal consolidation. No pleural effusion or pneumothorax. Upper Abdomen: Visualized upper abdomen is notable for cholelithiasis and vascular calcifications. Musculoskeletal: Degenerative changes of the visualized thoracolumbar spine. IMPRESSION: 6 mm (mean diameter) sub solid right lower lobe nodule, unchanged. Repeat CT is recommended every 2 years until 5 years of stability has been established. This recommendation follows the consensus statement: Guidelines for Management of  Incidental Pulmonary Nodules Detected on CT Images: From the Fleischner Society 2017; Radiology 2017; 284:228-243. Additional small calcified and noncalcified pulmonary nodules measuring up to  5 mm in the left upper lobe, unchanged, benign. Dedicated follow-up imaging is not required per Fleischner Society guidelines. This recommendation follows the consensus statement: Guidelines for Management of Small Pulmonary Nodules Detected on CT Images: From the Fleischner Society 2017; Radiology 2017; 284:228-243. Aortic Atherosclerosis (ICD10-I70.0) and Emphysema (ICD10-J43.9). Electronically Signed   By: Charline Bills M.D.   On: 02/02/2020 09:40     Assessment and plan- Patient is a 86 y.o. female who presents to pulmonary nodule clinic for follow-up of incidental lung nodules.  A telephone visit was conducted to review most recent CT scan results.    CT chest without contrast from 02/02/2020 showed a 6 mm subsolid right lower lobe nodule that remains unchanged.  Repeat CT is recommended every 2 years until 5 years of stability has been established.  Additional small calcified and noncalcified pulmonary nodules measuring up to 5 mm in the left upper lobe remain unchanged and likely benign.   Calculating malignancy probability of a pulmonary nodule: Risk factors include: 1.  Age. 2.  Cancer history. 3.  Diameter of pulmonary nodule and mm 4.  Location 5.  Smoking history 6.  Spiculation present   Based on risk factors, this patient is low risk for the development of lung cancer. I would recommend a 26month follow-up with imaging to ensure stability given smoking history.  If imaging stable in 21-months, no additional follow-up would be needed.    During our visit, we discussed pulmonary nodules are a common incidental finding and are often how lung cancer is discovered.  Lung cancer survival is directly related to the stage at diagnosis.  We discussed that nodules can vary in presentation from solitary  pulmonary nodules to masses, 2 groundglass opacities and multiple nodules.  Pulmonary nodules in the majority of cases are benign but the probability of these becoming malignant cannot be undermined.  Early identification of malignant nodules could lead to early diagnosis and increased survival.   We discussed the probability of pulmonary nodules becoming malignant increase with age, pack years of tobacco use, size/characteristics of the nodule and location; with upper lobe involvement being most worrisome.   We discussed the goal of our clinic is to thoroughly evaluate each nodule, developed a comprehensive, individualized plan of care utilizing the most advanced technology and significantly reduce the time from detection to treatment.  A dedicated pulmonary nodule clinic has proven to indeed expedite the detection and treatment of lung cancer.   Patient education in fact sheet provided along with most recent CT scans.  Plan: Reviewed most recent CT chest. Discussed personal and family history of cancer. Reviewed occupational exposures. Reviewed smoking history.  Disposition: RTC in approximately 1 year for repeat CT chest without contrast to ensure stability. RTC to lung nodule clinic after to review results.    Visit Diagnosis No diagnosis found.  Patient expressed understanding and was in agreement with this plan. She also understands that She can call clinic at any time with any questions, concerns, or complaints.   Greater than 50% was spent in counseling and coordination of care with this patient including but not limited to discussion of the relevant topics above (See A&P) including, but not limited to diagnosis and management of acute and chronic medical conditions.   Thank you for allowing me to participate in the care of this very pleasant patient.    Mauro Kaufmann, NP [facility] Cell - [phone] Pager- [phone] 02/03/2020 9:42 AM  CC:  Dr.  Caryl Bis

## 2020-02-06 ENCOUNTER — Other Ambulatory Visit: Payer: Self-pay | Admitting: Oncology

## 2020-02-06 DIAGNOSIS — R911 Solitary pulmonary nodule: Secondary | ICD-10-CM

## 2020-02-06 NOTE — Progress Notes (Signed)
Repeat CT chest w.out contrast ordered.   Will get this scheduled for approximately one year from previous.   Durenda Hurt, NP 02/06/2020 11:47 AM

## 2020-02-11 DIAGNOSIS — H903 Sensorineural hearing loss, bilateral: Secondary | ICD-10-CM | POA: Diagnosis not present

## 2020-02-11 DIAGNOSIS — R42 Dizziness and giddiness: Secondary | ICD-10-CM | POA: Diagnosis not present

## 2020-02-17 ENCOUNTER — Telehealth: Payer: Self-pay | Admitting: *Deleted

## 2020-02-17 NOTE — Telephone Encounter (Signed)
Reviewed upcoming appts in the lung nodule clinic. Appts mailed per pt request.

## 2020-03-29 ENCOUNTER — Ambulatory Visit (INDEPENDENT_AMBULATORY_CARE_PROVIDER_SITE_OTHER): Payer: Medicare Other

## 2020-03-29 VITALS — Ht 59.0 in | Wt 149.0 lb

## 2020-03-29 DIAGNOSIS — Z0000 Encounter for general adult medical examination without abnormal findings: Secondary | ICD-10-CM

## 2020-03-29 NOTE — Progress Notes (Signed)
Subjective:   Gail Pierce is a 86 y.o. female who presents for Medicare Annual (Subsequent) preventive examination.  Review of Systems    No ROS.  Medicare Wellness Virtual Visit.   Cardiac Risk Factors include: advanced age (>57men, >34 women);hypertension     Objective:    Today's Vitals   03/29/20 1034  Weight: 149 lb (67.6 kg)  Height: 4\' 11"  (1.499 m)   Body mass index is 30.09 kg/m.  Advanced Directives 03/29/2020 03/27/2019 03/25/2018  Does Patient Have a Medical Advance Directive? Yes Yes Yes  Type of 03/27/2018 of [place];Living will Healthcare Power of [place];Living will Healthcare Power of [place];Living will  Does patient want to make changes to medical advance directive? No - Patient declined No - Patient declined No - Patient declined  Copy of Healthcare Power of Attorney in Chart? No - copy requested No - copy requested No - copy requested    Current Medications (verified) Outpatient Encounter Medications as of 03/29/2020  Medication Sig  . alendronate (FOSAMAX) 70 MG tablet Take 1 tablet (70 mg total) by mouth every 7 (seven) days. Take with a full glass of water on an empty stomach.  03/31/2020 amLODipine (NORVASC) 5 MG tablet Take 1 tablet (5 mg total) by mouth daily.  . Ascorbic Acid (VITAMIN C) 100 MG tablet Take 100 mg by mouth daily.  Marland Kitchen aspirin EC 81 MG tablet Take 81 mg by mouth daily.  . Calcium Carbonate-Vitamin D (CALCIUM 500/D PO) Take by mouth.  . Cholecalciferol (VITAMIN D3) 1000 units CAPS Take by mouth.  . ciclopirox (PENLAC) 8 % solution ciclopirox 8 % topical solution  APPLY TOPICALLY NIGHTLY OVER NAIL AND SURROUNDING SKIN.  Marland Kitchen Corn Dextrin (EASY FIBER PO) Take by mouth.  . Cyanocobalamin 2500 MCG CHEW Chew by mouth.  . DHA-EPA-Flaxseed Oil-Vitamin E (THERA TEARS NUTRITION PO) Take by mouth.  . Glucosamine-Chondroit-Vit C-Mn (GLUCOSAMINE 1500 COMPLEX PO) Take by mouth.  . Influenza Vac High-Dose Quad (FLUZONE HIGH-DOSE  QUADRIVALENT) 0.7 ML SUSY Fluzone High-Dose Quad 2020-21 (PF) 240 mcg/0.7 mL IM syringe  . Multiple Vitamins-Minerals (CENTRUM ADULTS PO) Take by mouth.  . vitamin A 8000 UNIT capsule Take 8,000 Units by mouth daily.   No facility-administered encounter medications on file as of 03/29/2020.    Allergies (verified) Patient has no known allergies.   History: Past Medical History:  Diagnosis Date  . Allergy   . Hx of diverticulitis of colon   . Hypertension    Past Surgical History:  Procedure Laterality Date  . BREAST BIOPSY Left 1980   benign  . SEPTOPLASTY    . TONSILLECTOMY     Family History  Problem Relation Age of Onset  . Leukemia Mother   . Heart attack Father    Social History   Socioeconomic History  . Marital status: Widowed    Spouse name: Not on file  . Number of children: Not on file  . Years of education: Not on file  . Highest education level: Not on file  Occupational History  . Not on file  Tobacco Use  . Smoking status: Former 03/31/2020  . Smokeless tobacco: Never Used  Vaping Use  . Vaping Use: Never used  Substance and Sexual Activity  . Alcohol use: Yes    Comment: OCC  . Drug use: No  . Sexual activity: Not on file  Other Topics Concern  . Not on file  Social History Narrative  . Not on file   Social Determinants  of Health   Financial Resource Strain:   . Difficulty of Paying Living Expenses:   Food Insecurity:   . Worried About Programme researcher, broadcasting/film/video in the Last Year:   . Barista in the Last Year:   Transportation Needs:   . Freight forwarder (Medical):   Marland Kitchen Lack of Transportation (Non-Medical):   Physical Activity:   . Days of Exercise per Week:   . Minutes of Exercise per Session:   Stress:   . Feeling of Stress :   Social Connections: Moderately Isolated  . Frequency of Communication with Friends and Family: More than three times a week  . Frequency of Social Gatherings with Friends and Family: More than three times  a week  . Attends Religious Services: Never  . Active Member of Clubs or Organizations: Yes  . Attends Banker Meetings: 1 to 4 times per year  . Marital Status: Widowed    Tobacco Counseling Counseling given: Not Answered   Clinical Intake:  Pre-visit preparation completed: Yes        Diabetes: No  How often do you need to have someone help you when you read instructions, pamphlets, or other written materials from your doctor or pharmacy?: 1 - Never   Interpreter Needed?: No      Activities of Daily Living In your present state of health, do you have any difficulty performing the following activities: 03/29/2020  Hearing? Y  Comment Hearing aids  Vision? N  Difficulty concentrating or making decisions? N  Walking or climbing stairs? N  Dressing or bathing? N  Doing errands, shopping? N  Preparing Food and eating ? N  Using the Toilet? N  In the past six months, have you accidently leaked urine? N  Do you have problems with loss of bowel control? N  Managing your Medications? N  Managing your Finances? N  Housekeeping or managing your Housekeeping? N  Some recent data might be hidden    Patient Care Team: Glori Luis, MD as PCP - General (Family Medicine)  Indicate any recent Medical Services you may have received from other than [facility] providers in the past year (date may be approximate).     Assessment:   This is a routine wellness examination for Gail Pierce.  I connected with Gail Pierce today by telephone and verified that I am speaking with the correct person using two identifiers. Location patient: home Location provider: work Persons participating in the virtual visit: patient, Engineer, civil (consulting).    I discussed the limitations, risks, security and privacy concerns of performing an evaluation and management service by telephone and the availability of in person appointments. The patient expressed understanding and verbally consented to this  telephonic visit.    Interactive audio and video telecommunications were attempted between this provider and patient, however failed, due to patient having technical difficulties OR patient did not have access to video capability.  We continued and completed visit with audio only.  Some vital signs may be absent or patient reported.   Hearing/Vision screen  Hearing Screening   125Hz  250Hz  500Hz  1000Hz  2000Hz  3000Hz  4000Hz  6000Hz  8000Hz   Right ear:           Left ear:           Comments: Hearing aid  Vision Screening Comments: Followed by  Wears corrective lenses Cataract extraction, bilateral Visual acuity not assessed, virtual visit.  They have seen their ophthalmologist in the last 12 months.     Dietary  issues and exercise activities discussed: Healthy diet Good water intake Caffeine- 1 cup of coffee Current Exercise Habits: Home exercise routine, Type of exercise: walking, Time (Minutes): 45, Frequency (Times/Week): 5, Weekly Exercise (Minutes/Week): 225, Intensity: Mild  Goals      Patient Stated   .  Increase physical activity (pt-stated)      Increase walking as tolerated and directed by physician      Depression Screen PHQ 2/9 Scores 03/29/2020 06/30/2019 03/27/2019 09/25/2018 08/05/2018 03/25/2018 03/23/2017  PHQ - 2 Score 0 0 0 0 0 0 0  PHQ- 9 Score - - - - - - 0    Fall Risk Fall Risk  03/29/2020 06/30/2019 03/27/2019 09/25/2018 08/05/2018  Falls in the past year? 1 0 0 0 0  Number falls in past yr: 1 0 - 0 0  Comment No falls since reported in January - - - -  Injury with Fall? 1 - - 0 0  Risk for fall due to : (No Data) - - - -  Risk for fall due to: Comment Notes balance is worse when hearing aids are not worn properly. - - - -  Follow up Falls evaluation completed Falls evaluation completed - - -   Handrails in use when climbing stairs? Yes  Home free of loose throw rugs in walkways, pet beds, electrical cords, etc? Yes  Adequate lighting in your home to  reduce risk of falls? Yes   ASSISTIVE DEVICES UTILIZED TO PREVENT FALLS: Use of a cane, walker or w/c? Yes , cane Grab bars in the bathroom? Yes  Shower chair or bench in shower? Yes  Elevated toilet seat or a handicapped toilet? Yes   TIMED UP AND GO: Was the test performed? No . Virtual visit.   Cognitive Function: Patient is alert and orient x3. She enjoys reading for brain health.   MMSE - Mini Mental State Exam 03/25/2018  Orientation to time 5  Orientation to Place 5  Registration 3  Attention/ Calculation 5  Recall 2  Language- name 2 objects 2  Language- repeat 1  Language- follow 3 step command 3  Language- read & follow direction 1  Write a sentence 1  Copy design 1  Total score 29     6CIT Screen 03/29/2020 03/27/2019  What Year? 0 points 0 points  What month? 0 points 0 points  What time? 0 points 0 points  Count back from 20 0 points 0 points  Months in reverse 0 points 0 points  Repeat phrase 6 points 0 points  Total Score 6 0    Immunizations Immunization History  Administered Date(s) Administered  . Influenza, High Dose Seasonal PF 05/28/2017, 05/13/2018, 06/10/2018, 06/03/2019  . PFIZER SARS-COV-2 Vaccination 10/17/2019, 11/11/2019  . Pneumococcal Conjugate-13 08/28/2011  . Pneumococcal Polysaccharide-23 12/31/2013  . Pneumococcal-Unspecified 08/28/2011  . Td 09/12/2015   Health Maintenance Health Maintenance  Topic Date Due  . PNA vac Low Risk Adult (2 of 2 - PCV13) 01/01/2015  . INFLUENZA VACCINE  04/11/2020  . TETANUS/TDAP  09/11/2025  . DEXA SCAN  Completed  . COVID-19 Vaccine  Completed   Dental Screening: Recommended annual dental exams for proper oral hygiene  Community Resource Referral / Chronic Care Management: CRR required this visit?  No   CCM required this visit?  No      Plan:    Keep all routine maintenance appointments.   Follow up 04/16/20 @ 10:30  I have personally reviewed and noted the following in the  patient's  chart:   . Medical and social history . Use of alcohol, tobacco or illicit drugs  . Current medications and supplements . Functional ability and status . Nutritional status . Physical activity . Advanced directives . List of other physicians . Hospitalizations, surgeries, and ER visits in previous 12 months . Vitals . Screenings to include cognitive, depression, and falls . Referrals and appointments  In addition, I have reviewed and discussed with patient certain preventive protocols, quality metrics, and best practice recommendations. A written personalized care plan for preventive services as well as general preventive health recommendations were provided to patient.     Ashok PallOBrien-Blaney, Markos Theil L, LPN   1/61/09607/19/2021

## 2020-03-29 NOTE — Patient Instructions (Addendum)
[name] , Thank you for taking time to come for your Medicare Wellness Visit. I appreciate your ongoing commitment to your health goals. Please review the following plan we discussed and let me know if I can assist you in the future.   These are the goals we discussed: Goals      Patient Stated     Increase physical activity (pt-stated)      Increase walking as tolerated and directed by physician       This is a list of the screening recommended for you and due dates:  Health Maintenance  Topic Date Due   Pneumonia vaccines (2 of 2 - PCV13) 01/01/2015   Flu Shot  04/11/2020   Tetanus Vaccine  09/11/2025   DEXA scan (bone density measurement)  Completed   COVID-19 Vaccine  Completed    Immunizations Immunization History  Administered Date(s) Administered   Influenza, High Dose Seasonal PF 05/28/2017, 05/13/2018, 06/10/2018, 06/03/2019   PFIZER SARS-COV-2 Vaccination 10/17/2019, 11/11/2019   Pneumococcal Conjugate-13 08/28/2011   Pneumococcal Polysaccharide-23 12/31/2013   Pneumococcal-Unspecified 08/28/2011   Td 09/12/2015   Advanced directives: End of life planning; Advance aging; Advanced directives discussed.  Copy of current HCPOA/Living Will requested.    Conditions/risks identified: none new  Follow up in one year for your annual wellness visit    Preventive Care 65 Years and Older, Female  Preventive care refers to lifestyle choices and visits with your health care provider that can promote health and wellness. What does preventive care include?  A yearly physical exam. This is also called an annual well check.  Dental exams once or twice a year.  Routine eye exams. Ask your health care provider how often you should have your eyes checked.  Personal lifestyle choices, including:  Daily care of your teeth and gums.  Regular physical activity.  Eating a healthy diet.  Avoiding tobacco and drug use.  Limiting alcohol use.  Practicing  safe sex.  Taking low-dose aspirin every day.  Taking vitamin and mineral supplements as recommended by your health care provider. What happens during an annual well check? The services and screenings done by your health care provider during your annual well check will depend on your age, overall health, lifestyle risk factors, and family history of disease. Counseling  Your health care provider may ask you questions about your:  Alcohol use.  Tobacco use.  Drug use.  Emotional well-being.  Home and relationship well-being.  Sexual activity.  Eating habits.  History of falls.  Memory and ability to understand (cognition).  Work and work Astronomer.  Reproductive health. Screening  You may have the following tests or measurements:  Height, weight, and BMI.  Blood pressure.  Lipid and cholesterol levels. These may be checked every 5 years, or more frequently if you are over 61 years old.  Skin check.  Lung cancer screening. You may have this screening every year starting at age 73 if you have a 30-pack-year history of smoking and currently smoke or have quit within the past 15 years.  Fecal occult blood test (FOBT) of the stool. You may have this test every year starting at age 28.  Flexible sigmoidoscopy or colonoscopy. You may have a sigmoidoscopy every 5 years or a colonoscopy every 10 years starting at age 62.  Hepatitis C blood test.  Hepatitis B blood test.  Sexually transmitted disease (STD) testing.  Diabetes screening. This is done by checking your blood sugar (glucose) after you have not eaten  for a while (fasting). You may have this done every 1-3 years.  Bone density scan. This is done to screen for osteoporosis. You may have this done starting at age 54.  Mammogram. This may be done every 1-2 years. Talk to your health care provider about how often you should have regular mammograms. Talk with your health care provider about your test results,  treatment options, and if necessary, the need for more tests. Vaccines  Your health care provider may recommend certain vaccines, such as:  Influenza vaccine. This is recommended every year.  Tetanus, diphtheria, and acellular pertussis (Tdap, Td) vaccine. You may need a Td booster every 10 years.  Zoster vaccine. You may need this after age 43.  Pneumococcal 13-valent conjugate (PCV13) vaccine. One dose is recommended after age 5.  Pneumococcal polysaccharide (PPSV23) vaccine. One dose is recommended after age 55. Talk to your health care provider about which screenings and vaccines you need and how often you need them. This information is not intended to replace advice given to you by your health care provider. Make sure you discuss any questions you have with your health care provider. Document Released: 09/24/2015 Document Revised: 05/17/2016 Document Reviewed: 06/29/2015 Elsevier Interactive Patient Education  2017 [employer].  Fall Prevention in the Home Falls can cause injuries. They can happen to people of all ages. There are many things you can do to make your home safe and to help prevent falls. What can I do on the outside of my home?  Regularly fix the edges of walkways and driveways and fix any cracks.  Remove anything that might make you trip as you walk through a door, such as a raised step or threshold.  Trim any bushes or trees on the path to your home.  Use bright outdoor lighting.  Clear any walking paths of anything that might make someone trip, such as rocks or tools.  Regularly check to see if handrails are loose or broken. Make sure that both sides of any steps have handrails.  Any raised decks and porches should have guardrails on the edges.  Have any leaves, snow, or ice cleared regularly.  Use sand or salt on walking paths during winter.  Clean up any spills in your garage right away. This includes oil or grease spills. What can I do in the  bathroom?  Use night lights.  Install grab bars by the toilet and in the tub and shower. Do not use towel bars as grab bars.  Use non-skid mats or decals in the tub or shower.  If you need to sit down in the shower, use a plastic, non-slip stool.  Keep the floor dry. Clean up any water that spills on the floor as soon as it happens.  Remove soap buildup in the tub or shower regularly.  Attach bath mats securely with double-sided non-slip rug tape.  Do not have throw rugs and other things on the floor that can make you trip. What can I do in the bedroom?  Use night lights.  Make sure that you have a light by your bed that is easy to reach.  Do not use any sheets or blankets that are too big for your bed. They should not hang down onto the floor.  Have a firm chair that has side arms. You can use this for support while you get dressed.  Do not have throw rugs and other things on the floor that can make you trip. What can I do  in the kitchen?  Clean up any spills right away.  Avoid walking on wet floors.  Keep items that you use a lot in easy-to-reach places.  If you need to reach something above you, use a strong step stool that has a grab bar.  Keep electrical cords out of the way.  Do not use floor polish or wax that makes floors slippery. If you must use wax, use non-skid floor wax.  Do not have throw rugs and other things on the floor that can make you trip. What can I do with my stairs?  Do not leave any items on the stairs.  Make sure that there are handrails on both sides of the stairs and use them. Fix handrails that are broken or loose. Make sure that handrails are as long as the stairways.  Check any carpeting to make sure that it is firmly attached to the stairs. Fix any carpet that is loose or worn.  Avoid having throw rugs at the top or bottom of the stairs. If you do have throw rugs, attach them to the floor with carpet tape.  Make sure that you have a  light switch at the top of the stairs and the bottom of the stairs. If you do not have them, ask someone to add them for you. What else can I do to help prevent falls?  Wear shoes that:  Do not have high heels.  Have rubber bottoms.  Are comfortable and fit you well.  Are closed at the toe. Do not wear sandals.  If you use a stepladder:  Make sure that it is fully opened. Do not climb a closed stepladder.  Make sure that both sides of the stepladder are locked into place.  Ask someone to hold it for you, if possible.  Clearly mark and make sure that you can see:  Any grab bars or handrails.  First and last steps.  Where the edge of each step is.  Use tools that help you move around (mobility aids) if they are needed. These include:  Canes.  Walkers.  Scooters.  Crutches.  Turn on the lights when you go into a dark area. Replace any light bulbs as soon as they burn out.  Set up your furniture so you have a clear path. Avoid moving your furniture around.  If any of your floors are uneven, fix them.  If there are any pets around you, be aware of where they are.  Review your medicines with your doctor. Some medicines can make you feel dizzy. This can increase your chance of falling. Ask your doctor what other things that you can do to help prevent falls. This information is not intended to replace advice given to you by your health care provider. Make sure you discuss any questions you have with your health care provider. Document Released: 06/24/2009 Document Revised: 02/03/2016 Document Reviewed: 10/02/2014 Elsevier Interactive Patient Education  2017 [employer].

## 2020-04-16 ENCOUNTER — Ambulatory Visit
Admission: RE | Admit: 2020-04-16 | Discharge: 2020-04-16 | Disposition: A | Discharge status: Home / Self Care | Payer: Medicare Other | Attending: Family Medicine | Admitting: Family Medicine

## 2020-04-16 ENCOUNTER — Other Ambulatory Visit: Payer: Self-pay

## 2020-04-16 ENCOUNTER — Ambulatory Visit
Admission: RE | Admit: 2020-04-16 | Discharge: 2020-04-16 | Disposition: A | Discharge status: Home / Self Care | Payer: Medicare Other | Source: Ambulatory Visit | Attending: Family Medicine | Admitting: Family Medicine

## 2020-04-16 ENCOUNTER — Telehealth: Payer: Self-pay | Admitting: Family Medicine

## 2020-04-16 ENCOUNTER — Ambulatory Visit (INDEPENDENT_AMBULATORY_CARE_PROVIDER_SITE_OTHER): Payer: Medicare Other | Admitting: Family Medicine

## 2020-04-16 DIAGNOSIS — M8580 Other specified disorders of bone density and structure, unspecified site: Secondary | ICD-10-CM | POA: Diagnosis not present

## 2020-04-16 DIAGNOSIS — M20012 Mallet finger of left finger(s): Secondary | ICD-10-CM | POA: Insufficient documentation

## 2020-04-16 DIAGNOSIS — I10 Essential (primary) hypertension: Secondary | ICD-10-CM | POA: Diagnosis not present

## 2020-04-16 DIAGNOSIS — S62613A Displaced fracture of proximal phalanx of left middle finger, initial encounter for closed fracture: Secondary | ICD-10-CM | POA: Diagnosis not present

## 2020-04-16 DIAGNOSIS — S62611A Displaced fracture of proximal phalanx of left index finger, initial encounter for closed fracture: Secondary | ICD-10-CM | POA: Diagnosis not present

## 2020-04-16 MED ORDER — AMLODIPINE BESYLATE 5 MG PO TABS: 5.0000 mg | ORAL_TABLET | Freq: Every day | ORAL | 3 refills | Status: DC

## 2020-04-16 MED ORDER — ALENDRONATE SODIUM 70 MG PO TABS: 70.0000 mg | ORAL_TABLET | ORAL | 3 refills | Status: DC

## 2020-04-16 MED ORDER — ALENDRONATE SODIUM 70 MG PO TABS: 70.0000 mg | ORAL_TABLET | ORAL | 3 refills | Status: AC

## 2020-04-16 NOTE — Assessment & Plan Note (Addendum)
Occurred during a fall.  We will x-ray her hand.  Refer to orthopedics.  Splint placed.  Advised to wear this full-time until she sees orthopedics for further evaluation.  Patient with mechanical fall with chronic balance difficulty contributing.  Discussed using a walker or cane at all times and monitoring where she is going.

## 2020-04-16 NOTE — Telephone Encounter (Signed)
Pt called about results. I let her know as soon as Dr. Birdie Sons gets results we will call her.

## 2020-04-16 NOTE — Assessment & Plan Note (Addendum)
Above goal though she has been out of her medicine.  We will restart amlodipine and have her return in 1 week for nurse BP check.

## 2020-04-16 NOTE — Progress Notes (Signed)
Marikay Alar, MD Phone: [phone]  Gail Pierce is a 86 y.o. female who presents today for f/u.  HYPERTENSION  Disease Monitoring  Home BP Monitoring similar to today or slightly lower Chest pain- no    Dyspnea- no Medications  Compliance-  Has been out of amlodipine recently.   Edema- no  Osteopenia: She has been off of her Fosamax since her prescription ran out.  She is due for DEXA scan in October.  Fall: Notes this occurred 2 weeks ago when she was walking on the sidewalk.  She tripped on an edge and fell forward onto her hands.  No head injury.  No loss of consciousness.  She notes her right hand does not bother her though her left hand has been somewhat painful particularly in her middle finger and her fifth finger.  She notes bruising has been progressively improving.  She notes inability to extend the tip of her left middle finger.  Balance difficulty: Patient notes she saw ENT for this.  They did not find any abnormalities that they did note she needed hearing aids.  They noted no BPPV.  She always walks with a cane or a walker.    Social History   Tobacco Use  Smoking Status Former Smoker  Smokeless Tobacco Never Used     ROS see history of present illness  Objective  Physical Exam Vitals:   04/16/20 1035 04/16/20 1100  BP: (!) 154/108 (!) 138/94  Pulse: (!) 55   Temp: 98 F (36.7 C)   SpO2: 98%     BP Readings from Last 3 Encounters:  04/16/20 (!) 138/94  03/27/19 140/73  02/25/19 (!) 148/80   Wt Readings from Last 3 Encounters:  04/16/20 123 lb 12.8 oz (56.2 kg)  03/29/20 149 lb (67.6 kg)  09/15/19 149 lb (67.6 kg)    Physical Exam Constitutional:      General: She is not in acute distress.    Appearance: She is not diaphoretic.  Cardiovascular:     Rate and Rhythm: Normal rate and regular rhythm.     Heart sounds: Normal heart sounds.  Pulmonary:     Effort: Pulmonary effort is normal.     Breath sounds: Normal breath sounds.    Musculoskeletal:     Right lower leg: No edema.     Left lower leg: No edema.     Comments: Mallet finger noted left middle finger, mild tenderness over the DIP joint in the left middle finger  Skin:    General: Skin is warm and dry.  Neurological:     Mental Status: She is alert.    Splint placed on left middle finger. No signs of neurovascular compromise before or after splint placement.   Assessment/Plan: Please see individual problem list.  Hypertension Above goal though she has been out of her medicine.  We will restart amlodipine and have her return in 1 week for nurse BP check.  Osteopenia Refill Fosamax.  She will schedule a DEXA scan in 3 months.  Mallet finger of left hand Occurred during a fall.  We will x-ray her hand.  Refer to orthopedics.  Splint placed.  Advised to wear this full-time until she sees orthopedics for further evaluation.  Patient with mechanical fall with chronic balance difficulty contributing.  Discussed using a walker or cane at all times and monitoring where she is going. advised to monitor for decreased sensation and temperature changes in the finger and if those occur she is to remove the  splint. Given extra wrapping material in case she has to remove the splint.   Orders Placed This Encounter  Procedures  . DG Hand Complete Left    Standing Status:   Future    Standing Expiration Date:   04/16/2021    Order Specific Question:   Reason for Exam (SYMPTOM  OR DIAGNOSIS REQUIRED)    Answer:   fall on to hand 2 weeks ago, pain in little finger and middle finger, evidence of mallet finger of middle finger on exam    Order Specific Question:   Preferred imaging location?    Answer:   [facility]    Order Specific Question:   Radiology Contrast Protocol - do NOT remove file path    Answer:   [email or web address]  . DG Bone Density    Standing Status:   Future    Standing Expiration Date:   04/16/2021    Order  Specific Question:   Reason for Exam (SYMPTOM  OR DIAGNOSIS REQUIRED)    Answer:   osteporosis follow-up    Order Specific Question:   Preferred imaging location?    Answer:   [facility]  . Ambulatory referral to Orthopedic Surgery    Referral Priority:   Routine    Referral Type:   Surgical    Referral Reason:   Specialty Services Required    Requested Specialty:   Orthopedic Surgery    Number of Visits Requested:   1    Meds ordered this encounter  Medications  . DISCONTD: alendronate (FOSAMAX) 70 MG tablet    Sig: Take 1 tablet (70 mg total) by mouth every 7 (seven) days. Take with a full glass of water on an empty stomach.    Dispense:  13 tablet    Refill:  3  . amLODipine (NORVASC) 5 MG tablet    Sig: Take 1 tablet (5 mg total) by mouth daily.    Dispense:  90 tablet    Refill:  3  . alendronate (FOSAMAX) 70 MG tablet    Sig: Take 1 tablet (70 mg total) by mouth every 7 (seven) days. Take with a full glass of water on an empty stomach.    Dispense:  13 tablet    Refill:  3    This visit occurred during the SARS-CoV-2 public health emergency.  Safety protocols were in place, including screening questions prior to the visit, additional usage of staff PPE, and extensive cleaning of exam room while observing appropriate contact time as indicated for disinfecting solutions.    Marikay Alar, MD [facility]

## 2020-04-16 NOTE — Patient Instructions (Signed)
Nice to see you. Please wear the splint full-time until you see orthopedics.  If you do not hear about the orthopedic appointment early next week please let us know. Please continue to walk with a cane and a walker. I refilled your medicines.

## 2020-04-16 NOTE — Telephone Encounter (Signed)
Pt has questions about her xray. Please call back at your earliest convenience.

## 2020-04-16 NOTE — Progress Notes (Signed)
Pre visit review using our clinic review tool, if applicable. No additional management support is needed unless otherwise documented below in the visit note. 

## 2020-04-16 NOTE — Assessment & Plan Note (Signed)
Refill Fosamax.  She will schedule a DEXA scan in 3 months.

## 2020-04-19 DIAGNOSIS — S62619A Displaced fracture of proximal phalanx of unspecified finger, initial encounter for closed fracture: Secondary | ICD-10-CM | POA: Insufficient documentation

## 2020-04-19 NOTE — Telephone Encounter (Signed)
Mal Amabile discussed results with patient on 04/16/20 per result note

## 2020-04-21 ENCOUNTER — Ambulatory Visit: Payer: Medicare Other

## 2020-04-21 DIAGNOSIS — S62611D Displaced fracture of proximal phalanx of left index finger, subsequent encounter for fracture with routine healing: Secondary | ICD-10-CM | POA: Diagnosis not present

## 2020-04-26 DIAGNOSIS — H905 Unspecified sensorineural hearing loss: Secondary | ICD-10-CM | POA: Diagnosis not present

## 2020-05-05 DIAGNOSIS — S62633A Displaced fracture of distal phalanx of left middle finger, initial encounter for closed fracture: Secondary | ICD-10-CM | POA: Diagnosis not present

## 2020-05-05 DIAGNOSIS — S62627A Displaced fracture of medial phalanx of left little finger, initial encounter for closed fracture: Secondary | ICD-10-CM | POA: Diagnosis not present

## 2020-05-21 DIAGNOSIS — S62633A Displaced fracture of distal phalanx of left middle finger, initial encounter for closed fracture: Secondary | ICD-10-CM | POA: Diagnosis not present

## 2020-06-21 ENCOUNTER — Telehealth: Payer: Self-pay | Admitting: Family Medicine

## 2020-06-21 ENCOUNTER — Emergency Department
Admission: EM | Admit: 2020-06-21 | Discharge: 2020-06-21 | Disposition: A | Discharge status: Home / Self Care | Payer: Medicare Other | Attending: Emergency Medicine | Admitting: Emergency Medicine

## 2020-06-21 ENCOUNTER — Emergency Department: Payer: Medicare Other

## 2020-06-21 ENCOUNTER — Encounter: Payer: Self-pay | Admitting: Emergency Medicine

## 2020-06-21 ENCOUNTER — Other Ambulatory Visit: Payer: Self-pay

## 2020-06-21 DIAGNOSIS — Z79899 Other long term (current) drug therapy: Secondary | ICD-10-CM | POA: Diagnosis not present

## 2020-06-21 DIAGNOSIS — Y92007 Garden or yard of unspecified non-institutional (private) residence as the place of occurrence of the external cause: Secondary | ICD-10-CM | POA: Insufficient documentation

## 2020-06-21 DIAGNOSIS — S0993XA Unspecified injury of face, initial encounter: Secondary | ICD-10-CM | POA: Diagnosis present

## 2020-06-21 DIAGNOSIS — W19XXXA Unspecified fall, initial encounter: Secondary | ICD-10-CM | POA: Diagnosis not present

## 2020-06-21 DIAGNOSIS — S0081XA Abrasion of other part of head, initial encounter: Secondary | ICD-10-CM | POA: Insufficient documentation

## 2020-06-21 DIAGNOSIS — I6782 Cerebral ischemia: Secondary | ICD-10-CM | POA: Insufficient documentation

## 2020-06-21 DIAGNOSIS — G9389 Other specified disorders of brain: Secondary | ICD-10-CM | POA: Diagnosis not present

## 2020-06-21 DIAGNOSIS — Y93H2 Activity, gardening and landscaping: Secondary | ICD-10-CM | POA: Diagnosis not present

## 2020-06-21 DIAGNOSIS — M25511 Pain in right shoulder: Secondary | ICD-10-CM | POA: Diagnosis not present

## 2020-06-21 DIAGNOSIS — Z7982 Long term (current) use of aspirin: Secondary | ICD-10-CM | POA: Insufficient documentation

## 2020-06-21 DIAGNOSIS — Z043 Encounter for examination and observation following other accident: Secondary | ICD-10-CM | POA: Diagnosis not present

## 2020-06-21 DIAGNOSIS — S0181XA Laceration without foreign body of other part of head, initial encounter: Secondary | ICD-10-CM | POA: Diagnosis not present

## 2020-06-21 DIAGNOSIS — I10 Essential (primary) hypertension: Secondary | ICD-10-CM | POA: Insufficient documentation

## 2020-06-21 DIAGNOSIS — Z87891 Personal history of nicotine dependence: Secondary | ICD-10-CM | POA: Diagnosis not present

## 2020-06-21 MED ORDER — TRAMADOL HCL 50 MG PO TABS: 50.0000 mg | ORAL_TABLET | Freq: Once | ORAL | Status: DC

## 2020-06-21 MED ORDER — ONDANSETRON 4 MG PO TBDP: 4.0000 mg | ORAL_TABLET | Freq: Once | ORAL | Status: DC

## 2020-06-21 NOTE — Telephone Encounter (Signed)
Pt son-in-law called saying that pt fell and hit her head and has several lacerations Pt is going to the ED.  Delbert Vu,cma

## 2020-06-21 NOTE — ED Notes (Signed)
See triage note- pt fell outside today. Pt hit R side of head and R shoulder. Abrasion and bruising noted on shoulder, dressing in place on forehead with bleeding well controlled at this time.

## 2020-06-21 NOTE — Discharge Instructions (Signed)
Take Tylenol at home for pain.

## 2020-06-21 NOTE — Telephone Encounter (Signed)
Pt son-in-law called saying that pt fell and hit her head and has several lacerations Pt is going to the ED

## 2020-06-21 NOTE — ED Triage Notes (Signed)
Says s he fell in her yard while gardening today.  [place]  Says she fell forward and then turned and hit her forehead.  Has some small lacerations on forehead.  No loc.

## 2020-06-21 NOTE — ED Provider Notes (Addendum)
Emergency Department Provider Note  ____________________________________________  Time seen: Approximately 5:58 PM  I have reviewed the triage vital signs and the nursing notes.   HISTORY  Chief Complaint Fall   Historian Patient    HPI Gail Pierce is a 86 y.o. female presents to the emergency department after patient had a mechanical fall after working in her roses.  Patient states that she did hit her head and her right shoulder.  She sustained some abrasions to her forehead.  No numbness or tingling in the upper extremities.  She denies chest pain, chest tightness or abdominal pain.  No other alleviating measures have been attempted.   Past Medical History:  Diagnosis Date  . Allergy   . Hx of diverticulitis of colon   . Hypertension      Immunizations up to date:  Yes.     Past Medical History:  Diagnosis Date  . Allergy   . Hx of diverticulitis of colon   . Hypertension     Patient Active Problem List   Diagnosis Date Noted  . Mallet finger of left hand 04/16/2020  . Acute pain of right knee 02/25/2019  . Osteopenia 08/05/2018  . Onychomycosis 08/05/2018  . Falls 09/24/2017  . Lung nodule seen on imaging study 03/23/2017  . Hypertension 02/09/2017  . Obesity (BMI 30.0-34.9) 02/09/2017  . Memory difficulty 02/09/2017  . Epiretinal membrane 02/09/2017    Past Surgical History:  Procedure Laterality Date  . BREAST BIOPSY Left 1980   benign  . SEPTOPLASTY    . TONSILLECTOMY      Prior to Admission medications   Medication Sig Start Date End Date Taking? Authorizing Provider  alendronate (FOSAMAX) 70 MG tablet Take 1 tablet (70 mg total) by mouth every 7 (seven) days. Take with a full glass of water on an empty stomach. 04/16/20   Glori Luis, MD  amLODipine (NORVASC) 5 MG tablet Take 1 tablet (5 mg total) by mouth daily. 04/16/20   Glori Luis, MD  Ascorbic Acid (VITAMIN C) 100 MG tablet Take 100 mg by mouth daily.    [provider]  aspirin EC 81 MG tablet Take 81 mg by mouth daily.    [provider]  Calcium Carbonate-Vitamin D (CALCIUM 500/D PO) Take by mouth.    [provider]  Cholecalciferol (VITAMIN D3) 1000 units CAPS Take by mouth.    [provider]  ciclopirox (PENLAC) 8 % solution ciclopirox 8 % topical solution  APPLY TOPICALLY NIGHTLY OVER NAIL AND SURROUNDING SKIN.    [provider]  Corn Dextrin (EASY FIBER PO) Take by mouth.    [provider]  Cyanocobalamin 2500 MCG CHEW Chew by mouth.    [provider]  DHA-EPA-Flaxseed Oil-Vitamin E (THERA TEARS NUTRITION PO) Take by mouth.    [provider]  Glucosamine-Chondroit-Vit C-Mn (GLUCOSAMINE 1500 COMPLEX PO) Take by mouth.    [provider]  Influenza Vac High-Dose Quad (FLUZONE HIGH-DOSE QUADRIVALENT) 0.7 ML SUSY Fluzone High-Dose Quad 2020-21 (PF) 240 mcg/0.7 mL IM syringe    [provider]  Influenza vac split quadrivalent PF (FLUZONE HIGH-DOSE) 0.5 ML injection Fluzone High-Dose 2019-20 (PF) 180 mcg/0.5 mL intramuscular syringe  PHARMACIST ADMINISTERED IMMUNIZATION ADMINISTERED AT TIME OF DISPENSING    [provider]  Multiple Vitamins-Minerals (CENTRUM ADULTS PO) Take by mouth.    [provider]  vitamin A 8000 UNIT capsule Take 8,000 Units by mouth daily.    [provider]  Allergies Patient has no known allergies.  Family History  Problem Relation Age of Onset  . Leukemia Mother   . Heart attack Father     Social History Social History   Tobacco Use  . Smoking status: Former Games developermoker  . Smokeless tobacco: Never Used  Vaping Use  . Vaping Use: Never used  Substance Use Topics  . Alcohol use: Yes    Comment: OCC  . Drug use: No     Review of Systems  Constitutional: No fever/chills Eyes:  No discharge ENT: No upper respiratory complaints. Respiratory: no cough. No SOB/ use of accessory muscles to  breath Gastrointestinal:   No nausea, no vomiting.  No diarrhea.  No constipation. Musculoskeletal: Patient has right shoulder pain.  Skin: Patient has facial abrasions.  ____________________________________________   PHYSICAL EXAM:  VITAL SIGNS: ED Triage Vitals  Enc Vitals Group     BP 06/21/20 1545 (!) 179/89     Pulse Rate 06/21/20 1545 (!) 59     Resp 06/21/20 1545 14     Temp 06/21/20 1545 97.7 F (36.5 C)     Temp Source 06/21/20 1545 Oral     SpO2 06/21/20 1545 100 %     Weight 06/21/20 1547 122 lb (55.3 kg)     Height 06/21/20 1547 4\' 11"  (1.499 m)     Head Circumference --      Peak Flow --      Pain Score 06/21/20 1545 0     Pain Loc --      Pain Edu? --      Excl. in GC? --      Constitutional: Alert and oriented. Well appearing and in no acute distress. Eyes: Conjunctivae are normal. PERRL. EOMI. Head: Atraumatic. ENT:      Nose: No congestion/rhinnorhea.      Mouth/Throat: Mucous membranes are moist.  Neck: No stridor.  Full range of motion.  No midline C-spine tenderness to palpation. Cardiovascular: Normal rate, regular rhythm. Normal S1 and S2.  Good peripheral circulation. Respiratory: Normal respiratory effort without tachypnea or retractions. Lungs CTAB. Good air entry to the bases with no decreased or absent breath sounds Gastrointestinal: Bowel sounds x 4 quadrants. Soft and nontender to palpation. No guarding or rigidity. No distention. Musculoskeletal: Patient has symmetric strength in the upper extremities.  Full range of motion to all extremities. No obvious deformities noted Neurologic:  Normal for age. No gross focal neurologic deficits are appreciated.  Patient can perform rapid alternating movements and hand to nose. Skin:  Skin is warm, dry and intact. No rash noted. Psychiatric: Mood and affect are normal for age. Speech and behavior are normal.   ____________________________________________   LABS (all labs ordered are listed, but only  abnormal results are displayed)  Labs Reviewed - No data to display ____________________________________________  EKG   ____________________________________________  RADIOLOGY Geraldo PitterI, Sumi Lye M Trask Vosler, personally viewed and evaluated these images (plain radiographs) as part of my medical decision making, as well as reviewing the written report by the radiologist.  CT Head Wo Contrast  Result Date: 06/21/2020 CLINICAL DATA:  Fall, forehead laceration EXAM: CT HEAD WITHOUT CONTRAST TECHNIQUE: Contiguous axial images were obtained from the base of the skull through the vertex without intravenous contrast. COMPARISON:  09/16/2019 FINDINGS: Brain: New encephalomalacia within the right parietooccipital region suggesting prior infarct. No evidence of an acute infarction. No evidence of intracranial hemorrhage or extra-axial collection. Moderate low-density changes within the periventricular and subcortical white matter compatible with chronic  microvascular ischemic change. Mild diffuse cerebral volume loss. Vascular: Atherosclerotic calcifications involving the large vessels of the skull base. No unexpected hyperdense vessel. Skull: Normal. Negative for fracture or focal lesion. Sinuses/Orbits: No acute finding. Other: Mild soft tissue swelling overlies the lower right frontal bone. IMPRESSION: 1. No acute intracranial findings. 2. Mild soft tissue swelling overlies the lower right frontal bone. No underlying calvarial fracture. 3. 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 This is new from 09/16/2019. 4. Chronic microvascular ischemic change and cerebral volume loss. Electronically Signed   By: Duanne Guess D.O.   On: 06/21/2020 16:37   CT Cervical Spine Wo Contrast  Result Date: 06/21/2020 CLINICAL DATA:  Fall while gardening EXAM: CT CERVICAL SPINE WITHOUT CONTRAST TECHNIQUE: Multidetector CT imaging of the cervical spine was performed without intravenous  contrast. Multiplanar CT image reconstructions were also generated. COMPARISON:  None. FINDINGS: Alignment: Stabilization collar is absent at the time of examination. Preservation of normal cervical lordosis. Mild anterolisthesis C2 on 3, retrolisthesis C3 on 4 and stepwise anterolisthesis C6-T1 are favored to be on a spondylitic/degenerative basis. No evidence of traumatic listhesis. No abnormally widened, perched or jumped facets. Normal alignment of the craniocervical and atlantoaxial articulations. Skull base and vertebrae: No acute skull base fracture. No vertebral body fracture or height loss. Normal bone mineralization. No worrisome osseous lesions. Multilevel cervical spondylitic changes, with endplate spurring and Schmorl's node formations, as detailed below. Additional moderate arthrosis at the atlanto-dental interval with some calcific pannus formation, nonspecific but can be seen with CPPD or rheumatoid arthropathy. Soft tissues and spinal canal: No pre or paravertebral fluid or swelling. No visible canal hematoma. Disc levels: Multilevel intervertebral disc height loss with spondylitic endplate changes. Larger disc osteophyte complexes C2-3, C3-4 and C5-6 partially efface the ventral thecal sac without significant spinal canal impingement. Multilevel uncinate spurring facet hypertrophic changes are present as well resulting in mild-to-moderate multilevel neural foraminal narrowing with more moderate to severe narrowing bilaterally at C3-4. Upper chest: No acute abnormality in the upper chest or imaged lung apices. Other: No visible thyroid nodules within the partially included thyroid. Cervical carotid atherosclerosis. IMPRESSION: 1. No acute fracture or traumatic listhesis of the cervical spine. 2. Multilevel cervical spondylitic and facet hypertrophic changes, as above. 3. Cervical carotid atherosclerosis. 4. 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 Electronically Signed   By: Kreg Shropshire M.D.   On: 06/21/2020 16:38    ____________________________________________    PROCEDURES  Procedure(s) performed:     Marland KitchenMarland KitchenLaceration Repair  Date/Time: 06/21/2020 6:02 PM Performed by: Orvil Feil, PA-C Authorized by: Orvil Feil, PA-C   Consent:    Consent obtained:  Verbal   Consent given by:  Patient Anesthesia (see MAR for exact dosages):    Anesthesia method:  None Laceration details:    Location:  Face   Face location:  Forehead   Length (cm):  1   Depth (mm):  2 Repair type:    Repair type:  Simple Exploration:    Contaminated: no   Treatment:    Area cleansed with:  Betadine   Amount of cleaning:  Standard   Irrigation solution:  Sterile saline   Visualized foreign bodies/material removed: no   Skin repair:    Repair method:  Tissue adhesive Approximation:    Approximation:  Close Post-procedure details:    Dressing:  Open (no dressing)   Patient tolerance of  procedure:  Tolerated well, no immediate complications       Medications  traMADol (ULTRAM) tablet 50 mg (has no administration in time range)  ondansetron (ZOFRAN-ODT) disintegrating tablet 4 mg (has no administration in time range)     ____________________________________________   INITIAL IMPRESSION / ASSESSMENT AND PLAN / ED COURSE  Pertinent labs & imaging results that were available during my care of the patient were reviewed by me and considered in my medical decision making (see chart for details).      Assessment and Plan:  Fall:  86 year old female presents to the emergency department after she had a mechanical fall.  Patient was hypertensive at triage and mildly bradycardic but vital signs otherwise reassuring.  She had no neuro deficits on exam.  Differential diagnosis includes intracranial bleed, skull fracture, C-spine fracture, shoulder fracture...  CT  head reveals no evidence of intracranial bleed or skull fracture.  No fractures of the cervical spine on CT cervical spine.  Patient has possible lucency identified on Y view of dedicated shoulder x-ray concerning for nondisplaced glenoid fracture.  Suspicion for fracture is low given range of motion and level of pain in the emergency department.  Regardless, will immobilize patient with a sling and have her follow-up with orthopedics.  Dermabond was placed over facial abrasions and basic wound care was given.  Patient was given tramadol in the emergency department for pain.  She was advised to take Tylenol at home for discomfort.  Return precautions were given to return with new or worsening symptoms.  All patient questions were answered.   ____________________________________________  FINAL CLINICAL IMPRESSION(S) / ED DIAGNOSES  Final diagnoses:  Fall, initial encounter      NEW MEDICATIONS STARTED DURING THIS VISIT:  ED Discharge Orders    None          This chart was dictated using voice recognition software/Dragon. Despite best efforts to proofread, errors can occur which can change the meaning. Any change was purely unintentional.     Orvil Feil, PA-C 06/21/20 1804    Pia Mau [place], PA-C 06/21/20 1814    Phineas Semen, MD 06/21/20 2013

## 2020-06-21 NOTE — Telephone Encounter (Signed)
Noted. Patient is in the ED now based on chart review.

## 2020-06-22 ENCOUNTER — Ambulatory Visit
Admission: RE | Admit: 2020-06-22 | Discharge: 2020-06-22 | Disposition: A | Discharge status: Home / Self Care | Payer: Medicare Other | Source: Ambulatory Visit | Attending: Family Medicine | Admitting: Family Medicine

## 2020-06-22 DIAGNOSIS — Z780 Asymptomatic menopausal state: Secondary | ICD-10-CM | POA: Insufficient documentation

## 2020-06-22 DIAGNOSIS — Z87828 Personal history of other (healed) physical injury and trauma: Secondary | ICD-10-CM | POA: Diagnosis not present

## 2020-06-22 DIAGNOSIS — M8589 Other specified disorders of bone density and structure, multiple sites: Secondary | ICD-10-CM | POA: Diagnosis not present

## 2020-06-22 DIAGNOSIS — M8580 Other specified disorders of bone density and structure, unspecified site: Secondary | ICD-10-CM | POA: Diagnosis not present

## 2020-06-22 DIAGNOSIS — R634 Abnormal weight loss: Secondary | ICD-10-CM | POA: Diagnosis not present

## 2020-06-22 DIAGNOSIS — R29890 Loss of height: Secondary | ICD-10-CM | POA: Diagnosis not present

## 2020-06-22 DIAGNOSIS — Z13820 Encounter for screening for osteoporosis: Secondary | ICD-10-CM | POA: Insufficient documentation

## 2020-06-28 DIAGNOSIS — S46011A Strain of muscle(s) and tendon(s) of the rotator cuff of right shoulder, initial encounter: Secondary | ICD-10-CM | POA: Diagnosis not present

## 2020-07-06 ENCOUNTER — Other Ambulatory Visit: Payer: Self-pay

## 2020-07-12 ENCOUNTER — Other Ambulatory Visit: Payer: Self-pay | Admitting: Family Medicine

## 2020-07-16 ENCOUNTER — Telehealth: Payer: Self-pay | Admitting: Family Medicine

## 2020-07-16 MED ORDER — AMLODIPINE BESYLATE 5 MG PO TABS: 5.0000 mg | ORAL_TABLET | Freq: Every day | ORAL | 3 refills | Status: DC

## 2020-07-16 NOTE — Telephone Encounter (Signed)
Patient called in need refill foramLODipine (NORVASC) 5 MG tablet

## 2020-07-21 ENCOUNTER — Other Ambulatory Visit: Payer: Self-pay

## 2020-07-21 ENCOUNTER — Telehealth: Payer: Self-pay

## 2020-07-21 ENCOUNTER — Encounter: Payer: Self-pay | Admitting: Family Medicine

## 2020-07-21 ENCOUNTER — Ambulatory Visit (INDEPENDENT_AMBULATORY_CARE_PROVIDER_SITE_OTHER): Payer: Medicare Other | Admitting: Family Medicine

## 2020-07-21 ENCOUNTER — Ambulatory Visit (INDEPENDENT_AMBULATORY_CARE_PROVIDER_SITE_OTHER): Payer: Medicare Other

## 2020-07-21 VITALS — BP 140/80 | HR 79 | Temp 98.7°F | Ht 59.0 in | Wt 121.6 lb

## 2020-07-21 DIAGNOSIS — M25511 Pain in right shoulder: Secondary | ICD-10-CM | POA: Diagnosis not present

## 2020-07-21 DIAGNOSIS — M8580 Other specified disorders of bone density and structure, unspecified site: Secondary | ICD-10-CM | POA: Diagnosis not present

## 2020-07-21 DIAGNOSIS — T452X1A Poisoning by vitamins, accidental (unintentional), initial encounter: Secondary | ICD-10-CM

## 2020-07-21 DIAGNOSIS — W19XXXA Unspecified fall, initial encounter: Secondary | ICD-10-CM

## 2020-07-21 DIAGNOSIS — Z8673 Personal history of transient ischemic attack (TIA), and cerebral infarction without residual deficits: Secondary | ICD-10-CM | POA: Insufficient documentation

## 2020-07-21 DIAGNOSIS — S4992XA Unspecified injury of left shoulder and upper arm, initial encounter: Secondary | ICD-10-CM

## 2020-07-21 DIAGNOSIS — I10 Essential (primary) hypertension: Secondary | ICD-10-CM

## 2020-07-21 HISTORY — DX: Personal history of transient ischemic attack (TIA), and cerebral infarction without residual deficits: Z86.73

## 2020-07-21 LAB — COMPREHENSIVE METABOLIC PANEL
ALT: 18 U/L (ref 0–35)
AST: 23 U/L (ref 0–37)
Albumin: 4.4 g/dL (ref 3.5–5.2)
Alkaline Phosphatase: 58 U/L (ref 39–117)
BUN: 13 mg/dL (ref 6–23)
CO2: 30 mEq/L (ref 19–32)
Calcium: 10.4 mg/dL (ref 8.4–10.5)
Chloride: 102 mEq/L (ref 96–112)
Creatinine, Ser: 0.71 mg/dL (ref 0.40–1.20)
GFR: 76.71 mL/min (ref >60.00)
Glucose, Bld: 97 mg/dL (ref 70–99)
Potassium: 4.1 mEq/L (ref 3.5–5.1)
Sodium: 138 mEq/L (ref 135–145)
Total Bilirubin: 0.5 mg/dL (ref 0.2–1.2)
Total Protein: 6.8 g/dL (ref 6.0–8.3)

## 2020-07-21 LAB — HEMOGLOBIN A1C: Hgb A1c MFr Bld: 5.8 % (ref 4.6–6.5)

## 2020-07-21 LAB — LIPID PANEL
Cholesterol: 163 mg/dL (ref 0–200)
HDL: 66.9 mg/dL (ref >39.00)
LDL Cholesterol: 78 mg/dL (ref 0–99)
NonHDL: 96.49
Total CHOL/HDL Ratio: 2
Triglycerides: 93 mg/dL (ref 0.0–149.0)
VLDL: 18.6 mg/dL (ref 0.0–40.0)

## 2020-07-21 LAB — VITAMIN D 25 HYDROXY (VIT D DEFICIENCY, FRACTURES): VITD: 102.25 ng/mL (ref 30.00–100.00)

## 2020-07-21 MED ORDER — AMLODIPINE BESYLATE 5 MG PO TABS: 10.0000 mg | ORAL_TABLET | Freq: Every day | ORAL | 3 refills | Status: DC

## 2020-07-21 NOTE — Assessment & Plan Note (Signed)
Falls precautions discussed.  Fall seem to be mechanical in nature.  Offered physical therapy.

## 2020-07-21 NOTE — Telephone Encounter (Signed)
Please let the patient know her vitamin D level is elevated. Please see how much vitamin D she is currently taking. I would advise stopping her vitamin D supplement and rechecking in a week or two.

## 2020-07-21 NOTE — Assessment & Plan Note (Signed)
Uncontrolled.  We will increase her amlodipine to 10 mg once daily.  Discussed monitoring for lightheadedness or diastolic blood pressures less than 55.  If those occur she will let us know.  She will return in 2 weeks for nurse BP check.

## 2020-07-21 NOTE — Telephone Encounter (Signed)
CRITICAL VALUE STICKER  CRITICAL VALUE: 102.25 (Vitamin D)  RECEIVER (on-site recipient of call): Mumin Denomme  DATE & TIME NOTIFIED: 07/21/20; 3:25  MESSENGER (representative from lab): [email or web address].Rack  MD NOTIFIED: Antony Haste  TIME OF NOTIFICATION: 3:30  RESPONSE:

## 2020-07-21 NOTE — Assessment & Plan Note (Signed)
Possible fracture of scapula on prior x-ray.  She was not evaluated by orthopedics.  We will repeat an x-ray today and see if there is still the potential for fracture.  Consider orthopedic referral if there is still potential for fracture.

## 2020-07-21 NOTE — Patient Instructions (Signed)
Nice to see you. We will get labs today and contact you with the results. Somebody will contact you to schedule the echo and carotid Dopplers. We are increasing your amlodipine to 10 mg once daily.  We will see you back in about 2 weeks for recheck of your blood pressure.

## 2020-07-21 NOTE — Assessment & Plan Note (Signed)
Imaging concerning for prior stroke.  She notes no symptoms consistent with a stroke.  We will work this up with an echo and carotid Dopplers.  Plan for screening lab work as well.

## 2020-07-21 NOTE — Progress Notes (Signed)
Tommi Rumps, MD Phone: [phone]  Gail Pierce is a 86 y.o. female who presents today for f/u.  HYPERTENSION  Disease Monitoring  Home BP Monitoring 180/60 most recently Chest pain- no    Dyspnea- no Medications  Compliance-  Taking amlodipine 5 mg daily.   Edema- no  Fall: Patient had a fall about a month ago.  She was in her garden on a slope and fell over.  She did hit her head and right shoulder.  She was evaluated in the emergency department.  CT head and neck with no acute changes though did show chronic changes consistent with prior stroke.  X-ray of her right shoulder did have suspicious changes for a scapular fracture and she was supposed to follow-up with orthopedics though did not.  She has not had any range of motion issues with the shoulder.  She does have occasional discomfort depending on what movement she does with the shoulder.  History of stroke: Patient notes no prior symptoms consistent with stroke.  No numbness or weakness.  Imaging revealed encephalomalacia within the right parieto-occipital region suggesting prior infarct that was new since January 2021.    Social History   Tobacco Use  Smoking Status Former Smoker  Smokeless Tobacco Never Used     ROS see history of present illness  Objective  Physical Exam Vitals:   07/21/20 0930  BP: 140/80  Pulse: 79  Temp: 98.7 F (37.1 C)  SpO2: 98%    BP Readings from Last 3 Encounters:  07/21/20 140/80  06/21/20 (!) 160/80  04/16/20 (!) 138/94   Wt Readings from Last 3 Encounters:  07/21/20 121 lb 9.6 oz (55.2 kg)  06/21/20 122 lb (55.3 kg)  04/16/20 123 lb 12.8 oz (56.2 kg)    Physical Exam Constitutional:      General: She is not in acute distress.    Appearance: She is not diaphoretic.  Cardiovascular:     Rate and Rhythm: Normal rate and regular rhythm.     Heart sounds: Normal heart sounds.  Pulmonary:     Effort: Pulmonary effort is normal.     Breath sounds: Normal breath  sounds.  Skin:    General: Skin is warm and dry.  Neurological:     Mental Status: She is alert.     Comments: EOMI, PERRL, facial sensation intact bilaterally, hearing intact to finger rub, shoulder shrug intact, 5/5 strength in bilateral biceps, triceps, grip, quads, hamstrings, plantar and dorsiflexion, sensation to light touch intact in bilateral UE and LE      Assessment/Plan: Please see individual problem list.  Problem List Items Addressed This Visit    Falls    Falls precautions discussed.  Fall seem to be mechanical in nature.  Offered physical therapy.      History of stroke - Primary    Imaging concerning for prior stroke.  She notes no symptoms consistent with a stroke.  We will work this up with an echo and carotid Dopplers.  Plan for screening lab work as well.      Relevant Orders   Comp Met (CMET)   Lipid panel   HgB A1c   ECHOCARDIOGRAM COMPLETE   US Carotid Duplex Bilateral   Hypertension    Uncontrolled.  We will increase her amlodipine to 10 mg once daily.  Discussed monitoring for lightheadedness or diastolic blood pressures less than 55.  If those occur she will let us know.  She will return in 2 weeks for nurse BP check.  Relevant Medications   amLODipine (NORVASC) 5 MG tablet   Injury of left shoulder    Possible fracture of scapula on prior x-ray.  She was not evaluated by orthopedics.  We will repeat an x-ray today and see if there is still the potential for fracture.  Consider orthopedic referral if there is still potential for fracture.      Relevant Orders   DG Shoulder Right   Osteopenia    Check vitamin D.      Relevant Orders   Vitamin D (25 hydroxy)      This visit occurred during the SARS-CoV-2 public health emergency.  Safety protocols were in place, including screening questions prior to the visit, additional usage of staff PPE, and extensive cleaning of exam room while observing appropriate contact time as indicated for  disinfecting solutions.    Tommi Rumps, MD [place]

## 2020-07-21 NOTE — Assessment & Plan Note (Signed)
Check vitamin D. 

## 2020-07-22 ENCOUNTER — Other Ambulatory Visit: Payer: Self-pay | Admitting: Family Medicine

## 2020-07-22 DIAGNOSIS — Z1231 Encounter for screening mammogram for malignant neoplasm of breast: Secondary | ICD-10-CM

## 2020-07-22 NOTE — Telephone Encounter (Signed)
I called and spoke with the patient and stated she is taking 125 mcg of vitamin D, I informed her to stop taking the vitamin D and I scheduled her in 2 weeks to recheck, patient stated she did not realize she was taking that much vitamin D.  Excell Neyland,cma

## 2020-07-22 NOTE — Addendum Note (Signed)
Addended by: Charlyne Mom D on: 07/22/2020 09:55 AM   Modules accepted: Orders

## 2020-07-22 NOTE — Telephone Encounter (Signed)
Noted  

## 2020-07-26 ENCOUNTER — Other Ambulatory Visit: Payer: Self-pay

## 2020-07-26 ENCOUNTER — Ambulatory Visit
Admission: RE | Admit: 2020-07-26 | Discharge: 2020-07-26 | Disposition: A | Discharge status: Home / Self Care | Payer: Medicare Other | Source: Ambulatory Visit | Attending: Family Medicine | Admitting: Family Medicine

## 2020-07-26 DIAGNOSIS — Z1231 Encounter for screening mammogram for malignant neoplasm of breast: Secondary | ICD-10-CM | POA: Diagnosis not present

## 2020-07-26 DIAGNOSIS — H35372 Puckering of macula, left eye: Secondary | ICD-10-CM | POA: Diagnosis not present

## 2020-07-28 ENCOUNTER — Telehealth: Payer: Self-pay

## 2020-07-28 DIAGNOSIS — E785 Hyperlipidemia, unspecified: Secondary | ICD-10-CM

## 2020-07-28 MED ORDER — ROSUVASTATIN CALCIUM 10 MG PO TABS: 10.0000 mg | ORAL_TABLET | Freq: Every day | ORAL | 3 refills | Status: DC

## 2020-07-28 NOTE — Telephone Encounter (Signed)
Sent to pharmacy 

## 2020-07-29 NOTE — Telephone Encounter (Signed)
Pt has concerns about rosuvastatin (CRESTOR) 10 MG tablet. She states that the pharmacist told her that it causes severe leg pain in people her size. Pt would like a call back

## 2020-07-29 NOTE — Telephone Encounter (Signed)
I called and spoke with the patient and informed her that everyone is different and for her to try the medication and if she starts having any leg pain or problems to let us know and we will try something different, she agreed.  Annai Heick,cma

## 2020-08-09 ENCOUNTER — Other Ambulatory Visit (INDEPENDENT_AMBULATORY_CARE_PROVIDER_SITE_OTHER): Payer: Medicare Other

## 2020-08-09 ENCOUNTER — Other Ambulatory Visit: Payer: Self-pay

## 2020-08-09 ENCOUNTER — Telehealth: Payer: Self-pay | Admitting: *Deleted

## 2020-08-09 DIAGNOSIS — T452X1A Poisoning by vitamins, accidental (unintentional), initial encounter: Secondary | ICD-10-CM

## 2020-08-09 DIAGNOSIS — E785 Hyperlipidemia, unspecified: Secondary | ICD-10-CM

## 2020-08-09 LAB — HEPATIC FUNCTION PANEL
ALT: 24 U/L (ref 0–35)
AST: 32 U/L (ref 0–37)
Albumin: 4.7 g/dL (ref 3.5–5.2)
Alkaline Phosphatase: 55 U/L (ref 39–117)
Bilirubin, Direct: 0.1 mg/dL (ref 0.0–0.3)
Total Bilirubin: 0.6 mg/dL (ref 0.2–1.2)
Total Protein: 7.4 g/dL (ref 6.0–8.3)

## 2020-08-09 LAB — LDL CHOLESTEROL, DIRECT: Direct LDL: 40 mg/dL

## 2020-08-09 LAB — VITAMIN D 25 HYDROXY (VIT D DEFICIENCY, FRACTURES): VITD: 116.06 ng/mL (ref 30.00–100.00)

## 2020-08-09 NOTE — Telephone Encounter (Signed)
Just FYI patient will stop D3 & vitamin with calcium as well as multi-vitamins until lab rechecked. She is scheduled.

## 2020-08-09 NOTE — Telephone Encounter (Signed)
CRITICAL VALUE STICKER  CRITICAL VALUE: Vitamin D-116.06  RECEIVER (on-site recipient of call):  DATE & TIME NOTIFIED: 08/09/20  MESSENGER (representative from lab):  MD NOTIFIED: Rennie Plowman (in Dr. Birdie Sons absence)  TIME OF NOTIFICATION: 2:55pm  RESPONSE:

## 2020-08-09 NOTE — Telephone Encounter (Signed)
Call pt  Vitamin d is VERY high. This can cause symptoms including-confusion, polyuria, polydipsia, anorexia, vomiting, and muscle weakness.  She needs to stop all supplemental sources of vitamin D. Please stop vitamin D and sch recheck of vit d in one week

## 2020-08-10 NOTE — Telephone Encounter (Signed)
FYI Patient notified regarding vitamin D

## 2020-08-10 NOTE — Telephone Encounter (Signed)
Thank you for taking care of this. She was supposed to have stopped the vitamin D previously. Hopefully she does it this time.

## 2020-08-17 ENCOUNTER — Other Ambulatory Visit: Payer: Self-pay

## 2020-08-17 ENCOUNTER — Telehealth: Payer: Self-pay | Admitting: Internal Medicine

## 2020-08-17 ENCOUNTER — Other Ambulatory Visit (INDEPENDENT_AMBULATORY_CARE_PROVIDER_SITE_OTHER): Payer: Medicare Other

## 2020-08-17 DIAGNOSIS — T452X1A Poisoning by vitamins, accidental (unintentional), initial encounter: Secondary | ICD-10-CM | POA: Diagnosis not present

## 2020-08-17 LAB — VITAMIN D 25 HYDROXY (VIT D DEFICIENCY, FRACTURES): VITD: 101.63 ng/mL (ref 30.00–100.00)

## 2020-08-17 NOTE — Telephone Encounter (Signed)
Critical vitamin D level 101.  Vitamin D level 08/09/20 - 116.  Has decreased.  Make sure off vitamin D supplements.  Agree with appt and bring all medications and supplements to see what she is taking.  Will need f/u vitamin D level.  Forward to Dr Birdie Sons.

## 2020-08-17 NOTE — Telephone Encounter (Signed)
FYI I called patient & she stated that she is not taking any Vitamin D since I spoke with her last week. Pt does have a hard time understanding me over the phone & kept saying Vitamin B. Then we clarified D & she still stated that she has d/c taking. Confirmed she does not take a multi-vitamin either. I asked just to be safe that when she comes in on 12/15 that she brings in all medications & supplements so we can took over. Pt stated that she would do so.

## 2020-08-18 ENCOUNTER — Telehealth: Payer: Self-pay | Admitting: Family Medicine

## 2020-08-18 NOTE — Telephone Encounter (Signed)
Noted. Agree with need to review all medications. Thanks for asking her to bring them in.

## 2020-08-18 NOTE — Telephone Encounter (Signed)
lft vm for pt daughter to call ofc to sch echo and [place].

## 2020-08-20 ENCOUNTER — Telehealth: Payer: Self-pay | Admitting: Family Medicine

## 2020-08-20 NOTE — Telephone Encounter (Signed)
lft pt daughter a vm regarding pt appt echo appt on 09/07/2020 arriving at 10:30 at [facility]. Thanks

## 2020-08-24 ENCOUNTER — Ambulatory Visit: Payer: Medicare Other | Attending: Unknown Physician Specialty | Admitting: Physical Therapy

## 2020-08-24 DIAGNOSIS — Z9181 History of falling: Secondary | ICD-10-CM | POA: Insufficient documentation

## 2020-08-24 DIAGNOSIS — R2681 Unsteadiness on feet: Secondary | ICD-10-CM | POA: Insufficient documentation

## 2020-08-25 ENCOUNTER — Ambulatory Visit: Payer: Medicare Other | Admitting: Physical Therapy

## 2020-08-25 ENCOUNTER — Ambulatory Visit
Admission: RE | Admit: 2020-08-25 | Discharge: 2020-08-25 | Disposition: A | Discharge status: Home / Self Care | Payer: Medicare Other | Source: Ambulatory Visit | Attending: Family Medicine | Admitting: Family Medicine

## 2020-08-25 ENCOUNTER — Encounter: Payer: Self-pay | Admitting: Family Medicine

## 2020-08-25 ENCOUNTER — Ambulatory Visit (INDEPENDENT_AMBULATORY_CARE_PROVIDER_SITE_OTHER): Payer: Medicare Other | Admitting: Family Medicine

## 2020-08-25 ENCOUNTER — Encounter: Payer: Self-pay | Admitting: Physical Therapy

## 2020-08-25 ENCOUNTER — Other Ambulatory Visit: Payer: Self-pay

## 2020-08-25 DIAGNOSIS — I6523 Occlusion and stenosis of bilateral carotid arteries: Secondary | ICD-10-CM | POA: Diagnosis not present

## 2020-08-25 DIAGNOSIS — R7989 Other specified abnormal findings of blood chemistry: Secondary | ICD-10-CM | POA: Insufficient documentation

## 2020-08-25 DIAGNOSIS — W19XXXD Unspecified fall, subsequent encounter: Secondary | ICD-10-CM

## 2020-08-25 DIAGNOSIS — Z8673 Personal history of transient ischemic attack (TIA), and cerebral infarction without residual deficits: Secondary | ICD-10-CM | POA: Diagnosis not present

## 2020-08-25 DIAGNOSIS — R2681 Unsteadiness on feet: Secondary | ICD-10-CM | POA: Diagnosis not present

## 2020-08-25 DIAGNOSIS — R911 Solitary pulmonary nodule: Secondary | ICD-10-CM | POA: Diagnosis not present

## 2020-08-25 DIAGNOSIS — Z9181 History of falling: Secondary | ICD-10-CM | POA: Diagnosis not present

## 2020-08-25 DIAGNOSIS — I499 Cardiac arrhythmia, unspecified: Secondary | ICD-10-CM | POA: Diagnosis not present

## 2020-08-25 LAB — VITAMIN D 25 HYDROXY (VIT D DEFICIENCY, FRACTURES): VITD: 91.21 ng/mL (ref 30.00–100.00)

## 2020-08-25 NOTE — Assessment & Plan Note (Signed)
I encouraged her to continue with physical therapy.  She will continue to use a cane or a walker at all times.

## 2020-08-25 NOTE — Therapy (Signed)
[facility] MAIN [facility] SERVICES [address] [place], [place], [address] Phone: [phone]   Fax:  [phone]  Physical Therapy Evaluation  Patient Details  Name: Gail Pierce MRN: [id number] Date of Birth: Feb 26, 1931 Referring Provider (PT): Jenne Campus MD   Encounter Date: 08/25/2020   PT End of Session - 08/25/20 1044    Visit Number 1    Number of Visits 9    Date for PT Re-Evaluation 10/20/20    Authorization Type Medicare, $35 copay    PT Start Time 0932    PT Stop Time 1030    PT Time Calculation (min) 58 min    Equipment Utilized During Treatment Gait belt    Activity Tolerance Patient tolerated treatment well    Behavior During Therapy WFL for tasks assessed/performed           Past Medical History:  Diagnosis Date  . Allergy   . Hx of diverticulitis of colon   . Hypertension     Past Surgical History:  Procedure Laterality Date  . BREAST BIOPSY Left 1980   benign  . SEPTOPLASTY    . TONSILLECTOMY      There were no vitals filed for this visit.    Subjective Assessment - 08/25/20 0937    Subjective "I live in a house that is on a hill and there is a big slope in my yard. So if I do something in my yard, I fall and roll down the hill."    Patient is accompained by: Family member   daughter, selinda;   Pertinent History 86 yo Female reports increased history of falls; She presents to therapy with [facility]. She reports her unsteadiness is worst outside in the yard. She denies any falls or unsteadiness inside her home. She reports she walks daily (2-3 miles with her neighbor) depending on the weather. Patient reports the sidewalks are not always in good shape and she will have to walk in the street. She said her last fall she was in the street and her foot tripped on the edge of the sidewalk, about 2 months ago. Daughter reports that most falls occur when there is a distraction. She has a rollator which she is using now when  walking the neighborhood and hasn't had a fall since; She reports using the [facility] when she runs errands such as to the store/doctor etc; Pt is still driving; She denies any dizziness; She denies any numbness/tingling;    How long can you sit comfortably? NA    How long can you stand comfortably? 30+ min, no difficulty    How long can you walk comfortably? 2-3 miles, depending on the weather;    Diagnostic tests none    Patient Stated Goals "Improve safety awareness, reduce falls; she is okay using walker right now."- improve foot clearance when walking;    Currently in Pain? No/denies    Multiple Pain Sites No              [facility] PT Assessment - 08/25/20 0001      Assessment   Medical Diagnosis Fall history    Referring Provider (PT) Jenne Campus MD    Onset Date/Surgical Date --   within the last year   Hand Dominance Right    Next MD Visit none scheduled    Prior Therapy had PT after an accident with good results; denies any PT for falls      Precautions   Precautions Fall    Required  Braces or Orthoses --   none     Restrictions   Weight Bearing Restrictions No      Balance Screen   Has the patient fallen in the past 6 months Yes    How many times? 2    Has the patient had a decrease in activity level because of a fear of falling?  No    Is the patient reluctant to leave their home because of a fear of falling?  No      Home Environment   Living Environment Private residence    Living Arrangements Alone    Available Help at Discharge Family    Type of Home House    Home Access Stairs to enter    Entrance Stairs-Number of Steps 1    Entrance Stairs-Rails None    Home Layout One level    Home Equipment Walker - 4 wheels;Cane - single point;Cane - quad    Additional Comments will use walker sometimes in the house but mostly when outside;      Prior Function   Level of Independence Independent with basic ADLs   uses rollator sometimes with gait   Vocation Retired    Leisure  likes to work in the yard, likes to walk daily;   does have difficulty using step Building control surveyor   Overall Cognitive Status Within Functional Limits for tasks assessed      Observation/Other Assessments   Observations very pleasant female, posture is [employer]    Focus on Therapeutic Outcomes (FOTO)  77%      Sensation   Light Touch Appears Intact    Proprioception Appears Intact      Coordination   Gross Motor Movements are Fluid and Coordinated Yes    Fine Motor Movements are Fluid and Coordinated No    Finger Nose Finger Test dysmetric with increased tremor in LUE, RUE is [facility]    Heel Shin Test [facility]      Posture/Postural Control   Posture Comments WFL      ROM / Strength   AROM / PROM / Strength AROM;Strength      AROM   Overall AROM Comments BUE and BLE are [facility]      Strength   Overall Strength Comments BUE and BLE are [facility]      Transfers   Comments able to transfer sit<>stand without pushing on chair, does exhibit increased posterior lean with increased repetition      Ambulation/Gait   Gait Comments ambulates without AD, supervision on level surface, narrow base of support, good gait speed, reciprocal gait pattern, slight veering to right side      Standardized Balance Assessment   Standardized Balance Assessment Five Times Sit to Stand;10 meter walk test    Five times sit to stand comments  11.37 sec wihtout HHA    10 Meter Walk 0.92 m/s without AD      High Level Balance   High Level Balance Comments standing on firm surface: feet together: eyes open: no sway, eyes closed: minimal to no sway, good stability; SLS: <1 sec each LE with increased difficulty on right side;      Functional Gait  Assessment   Gait assessed  Yes    Gait Level Surface Walks 20 ft in less than 5.5 sec, no assistive devices, good speed, no evidence for imbalance, normal gait pattern, deviates no more than 6 in outside of the 12 in walkway width.    Change in  Gait Speed Able to change speed,  demonstrates mild gait deviations, deviates 6-10 in outside of the 12 in walkway width, or no gait deviations, unable to achieve a major change in velocity, or uses a change in velocity, or uses an assistive device.    Gait with Horizontal Head Turns Performs head turns smoothly with slight change in gait velocity (eg, minor disruption to smooth gait path), deviates 6-10 in outside 12 in walkway width, or uses an assistive device.    Gait with Vertical Head Turns Performs task with slight change in gait velocity (eg, minor disruption to smooth gait path), deviates 6 - 10 in outside 12 in walkway width or uses assistive device    Gait and Pivot Turn Pivot turns safely within 3 sec and stops quickly with no loss of balance.    Step Over Obstacle Is able to step over one shoe box (4.5 in total height) without changing gait speed. No evidence of imbalance.    Gait with Narrow Base of Support Ambulates less than 4 steps heel to toe or cannot perform without assistance.    Gait with Eyes Closed Walks 20 ft, slow speed, abnormal gait pattern, evidence for imbalance, deviates 10-15 in outside 12 in walkway width. Requires more than 9 sec to ambulate 20 ft.    Ambulating Backwards Walks 20 ft, slow speed, abnormal gait pattern, evidence for imbalance, deviates 10-15 in outside 12 in walkway width.    Steps Alternating feet, must use rail.    Total Score 18    FGA comment: high risk for falls                      Objective measurements completed on examination: See above findings.               PT Education - 08/25/20 1043    Education Details plan of care    Person(s) Educated Patient;Child(ren)    Methods Explanation    Comprehension Verbalized understanding            PT Short Term Goals - 08/25/20 1048      PT SHORT TERM GOAL #1   Title Patient will be adherent to HEP at least 3x a week to improve balance and safety awareness.    Time 4    Period Weeks    Status New     Target Date 09/22/20      PT SHORT TERM GOAL #2   Title Patient will tolerate 5 seconds of single leg stance without loss of balance to improve ability to get in and out of shower safely.    Time 4    Period Weeks    Status New    Target Date 09/22/20             PT Long Term Goals - 08/25/20 1050      PT LONG TERM GOAL #1   Title Patient will increase Functional Gait Assessment score to >20/30 as to reduce fall risk and improve dynamic gait safety with community ambulation.    Time 8    Period Weeks    Status New    Target Date 10/20/20      PT LONG TERM GOAL #2   Title Patient will complete 5 times sit<>Stand in <15 sec without posterior loss of balance to exhibit improvement in transfers at home and reduce fall risk.    Time 8    Period Weeks    Status New  Target Date 10/20/20      PT LONG TERM GOAL #3   Title Patient will improve FOTO score to >78% to indicate improved functional mobility at home and in community.    Time 8    Period Weeks    Status New    Target Date 10/20/20                  Plan - 08/25/20 1044    Clinical Impression Statement 86 yo Female reports increased history of falls over last year. She reports her falls primarly occur when outside. She is an avid walker and will walk 2-3 miles daily with her neighbor as the weather permits. She reports her neighborhood is not level and the sidewalks are uneven. She reports the uneveness and walking when distracted (such as talking) will make her fall. She has started using a rollator which has significantly reduced her fall history. Patient does test as a high fall risk with most impairment noted with dynamic balance as evidenced with functional gait assessment. She exhibits good strength in BUE/BLE. In addition to fall risk, she does exhibit decreased coordination with LUE which she states is a result from finger fracture after a fall a few months ago. She reports not being concerned about the  incoordination and does not wish to address it at this time.  Patient would benefit from skilled PT Intervention to improve dynamic balance and safety awareness with tasks to reduce fall risk;    Personal Factors and Comorbidities Comorbidity 3+;Time since onset of injury/illness/exacerbation    Comorbidities hard of hearing (wears hearing aids), HTN, osteopenia, recent fall history;    Examination-Activity Limitations Other   walking on unlevel surfaces   Examination-Participation Restrictions Community Activity;Yard Work    Conservation officer, historic buildings Stable/Uncomplicated    Clinical Decision Making Low    Rehab Potential Good    PT Frequency 1x / week    PT Duration 8 weeks    PT Treatment/Interventions Cryotherapy;Moist Heat;Therapeutic activities;Therapeutic exercise;Balance training;Neuromuscular re-education;Functional mobility training;Patient/family education;Gait training;Stair training    PT Next Visit Plan initiate HEP, work on dynamic balance    PT Home Exercise Plan will address next session    Recommended Other Services none at this time;    Consulted and Agree with Plan of Care Patient           Patient will benefit from skilled therapeutic intervention in order to improve the following deficits and impairments:  Abnormal gait,Decreased balance,Decreased coordination,Decreased safety awareness  Visit Diagnosis: Unsteadiness on feet  History of falling     Problem List Patient Active Problem List   Diagnosis Date Noted  . History of stroke 07/21/2020  . Injury of left shoulder 07/21/2020  . Mallet finger of left hand 04/16/2020  . Acute pain of right knee 02/25/2019  . Osteopenia 08/05/2018  . Onychomycosis 08/05/2018  . Falls 09/24/2017  . Lung nodule seen on imaging study 03/23/2017  . Hypertension 02/09/2017  . Obesity (BMI 30.0-34.9) 02/09/2017  . Memory difficulty 02/09/2017  . Epiretinal membrane 02/09/2017    Vinette Crites PT,  DPT 08/25/2020, 10:52 AM  [facility] MAIN [facility] SERVICES [address] [place], [place], [address] Phone: [phone]   Fax:  [phone]  Name: Gail Pierce MRN: [id number] Date of Birth: 1930/10/23

## 2020-08-25 NOTE — Assessment & Plan Note (Signed)
LDL responded well to Crestor.  She will continue Crestor 10 mg once daily.  She will complete work-up with her echo.  We will contact her with her carotid Doppler results.

## 2020-08-25 NOTE — Assessment & Plan Note (Signed)
This is being followed through the cancer center as part of lung nodule screening follow-up protocol.

## 2020-08-25 NOTE — Progress Notes (Signed)
Marikay Alar, MD Phone: [phone]  Junette Bernat is a 86 y.o. female who presents today for f/u.  History of stroke: Patient has done relatively well on Crestor.  She had very brief myalgias 1 night after starting this though they resolved with getting up and moving around.  No recurrence.  She is cut down on red meat.  No numbness.  No vision changes.  Patient completed her carotid Doppler earlier and has an echo scheduled.  History of fall: Patient started physical therapy today.  They noted that she veers off to the right and are going to work on helping correct that.  She has been using a cane or rolling walker.  That helps as she does not pick her feet up very much while walking.  Elevated vitamin D: Patient has stopped all vitamin D supplementation.  Needs to be rechecked today.  Social History   Tobacco Use  Smoking Status Former Smoker  Smokeless Tobacco Never Used     ROS see history of present illness  Objective  Physical Exam Vitals:   08/25/20 1334  BP: 130/70  Pulse: 70  Temp: 98.5 F (36.9 C)  SpO2: 99%    BP Readings from Last 3 Encounters:  08/25/20 130/70  07/21/20 140/80  06/21/20 (!) 160/80   Wt Readings from Last 3 Encounters:  08/25/20 122 lb 12.8 oz (55.7 kg)  07/21/20 121 lb 9.6 oz (55.2 kg)  06/21/20 122 lb (55.3 kg)    Physical Exam Constitutional:      General: She is not in acute distress.    Appearance: She is not diaphoretic.  Cardiovascular:     Rate and Rhythm: Normal rate and regular rhythm.     Heart sounds: Normal heart sounds.  Pulmonary:     Effort: Pulmonary effort is normal.     Breath sounds: Normal breath sounds.  Musculoskeletal:        General: No edema.  Skin:    General: Skin is warm and dry.  Neurological:     Mental Status: She is alert.     Comments: Patient does not shuffle with her gait though she does not pick her feet up very high, 5/5 strength in bilateral biceps, triceps, grip, quads,  hamstrings, plantar and dorsiflexion, sensation to light touch intact in bilateral UE and LE      Assessment/Plan: Please see individual problem list.  Problem List Items Addressed This Visit    Falls    I encouraged her to continue with physical therapy.  She will continue to use a cane or a walker at all times.      High serum vitamin D    Patient will remain off of vitamin D supplementation.  We will recheck today.      Relevant Orders   Vitamin D (25 hydroxy)   History of stroke    LDL responded well to Crestor.  She will continue Crestor 10 mg once daily.  She will complete work-up with her echo.  We will contact her with her carotid Doppler results.      Lung nodule seen on imaging study    This is being followed through the cancer center as part of lung nodule screening follow-up protocol.         This visit occurred during the SARS-CoV-2 public health emergency.  Safety protocols were in place, including screening questions prior to the visit, additional usage of staff PPE, and extensive cleaning of exam room while observing appropriate contact time as  indicated for disinfecting solutions.    Tommi Rumps, MD [place]

## 2020-08-25 NOTE — Patient Instructions (Signed)
Nice to see you. We will check your vitamin D level today. Please continue to use a cane or a walker at all times.

## 2020-08-25 NOTE — Assessment & Plan Note (Signed)
Patient will remain off of vitamin D supplementation.  We will recheck today.

## 2020-08-26 ENCOUNTER — Ambulatory Visit: Payer: Medicare Other | Admitting: Physical Therapy

## 2020-08-31 ENCOUNTER — Ambulatory Visit: Payer: Medicare Other | Admitting: Physical Therapy

## 2020-08-31 ENCOUNTER — Other Ambulatory Visit: Payer: Self-pay

## 2020-08-31 ENCOUNTER — Encounter: Payer: Self-pay | Admitting: Physical Therapy

## 2020-08-31 DIAGNOSIS — R2681 Unsteadiness on feet: Secondary | ICD-10-CM | POA: Diagnosis not present

## 2020-08-31 DIAGNOSIS — Z9181 History of falling: Secondary | ICD-10-CM

## 2020-08-31 NOTE — Therapy (Signed)
[facility] MAIN [facility] SERVICES [address] [place], [place], [address] Phone: [phone]   Fax:  [phone]  Physical Therapy Treatment  Patient Details  Name: Gail Pierce MRN: [id number] Date of Birth: April 18, 1931 Referring Provider (PT): Jenne Campus MD   Encounter Date: 08/31/2020   PT End of Session - 08/31/20 1026    Visit Number 2    Number of Visits 9    Date for PT Re-Evaluation 10/20/20    Authorization Type Medicare, $35 copay    PT Start Time 1018    PT Stop Time 1100    PT Time Calculation (min) 42 min    Equipment Utilized During Treatment Gait belt    Activity Tolerance Patient tolerated treatment well    Behavior During Therapy [facility] for tasks assessed/performed           Past Medical History:  Diagnosis Date  . Allergy   . Hx of diverticulitis of colon   . Hypertension     Past Surgical History:  Procedure Laterality Date  . BREAST BIOPSY Left 1980   benign  . SEPTOPLASTY    . TONSILLECTOMY      There were no vitals filed for this visit.   Subjective Assessment - 08/31/20 1025    Subjective Patient reports doing well; no pain; Reports no change since last session;    Patient is accompained by: Family member   daughter, selinda;   Pertinent History 86 yo Female reports increased history of falls; She presents to therapy with SPC. She reports her unsteadiness is worst outside in the yard. She denies any falls or unsteadiness inside her home. She reports she walks daily (2-3 miles with her neighbor) depending on the weather. Patient reports the sidewalks are not always in good shape and she will have to walk in the street. She said her last fall she was in the street and her foot tripped on the edge of the sidewalk, about 2 months ago. Daughter reports that most falls occur when there is a distraction. She has a rollator which she is using now when walking the neighborhood and hasn't had a fall since; She reports  using the [facility] when she runs errands such as to the store/doctor etc; Pt is still driving; She denies any dizziness; She denies any numbness/tingling;    How long can you sit comfortably? NA    How long can you stand comfortably? 30+ min, no difficulty    How long can you walk comfortably? 2-3 miles, depending on the weather;    Diagnostic tests none    Patient Stated Goals "Improve safety awareness, reduce falls; she is okay using walker right now."- improve foot clearance when walking;    Currently in Pain? No/denies    Multiple Pain Sites No             TREATMENT: Warm up on Nustep BUE/BLE level 2 x4 min (unbilled)  Instructed patient in HEP: Exercises Standing Single Leg Stance with Counter Support - 1 x daily - 7 x weekly - 1 sets - 3-5 reps - 5 sec hold Half Tandem Stance Balance with Head Rotation - 1 x daily - 7 x weekly - 1 sets - 5 reps - 2 sec hold Backward Walking with Counter Support - 1 x daily - 7 x weekly - 1 sets - 3-5 reps Cross-Over Stepping  - 1 x daily - 7 x weekly - 1 sets - 3-5 reps  Patient required min VCs  for balance stability, including to increase trunk control for less loss of balance with smaller base of support   Standing in parallel bars: On airex pad: -alternate toe taps to 6 inch step with 2-1-0 rail assist x15 reps with min A for safety; -standing one foot on airex, one foot on 6 inch step:  Unsupported standing x10 sec  Unsupported standing with ball pass side/side x5 reps each foot on step -modified tandem stance with BUE ball up/down x10 reps each foot in front, CGA to min A for safety;  Resisted walking 7.5# forward/backward, side/side (2 way) x2 laps each with cues for weight shift for better dynamic balance control; Required min A for safety; Patient had a harder time with the eccentric return;  Patient tolerated advanced exercise well. She denies any pain; She did require min A when standing on compliant surface with narrow base of  support. Patient also had difficulty when reaching/turning head outside base of support with lateral loss of balance; Provided written HEP for adherence;                          PT Education - 08/31/20 1026    Education Details balance/HEP    Person(s) Educated Patient    Methods Explanation;Verbal cues    Comprehension Verbalized understanding;Returned demonstration;Verbal cues required;Need further instruction            PT Short Term Goals - 08/25/20 1048      PT SHORT TERM GOAL #1   Title Patient will be adherent to HEP at least 3x a week to improve balance and safety awareness.    Time 4    Period Weeks    Status New    Target Date 09/22/20      PT SHORT TERM GOAL #2   Title Patient will tolerate 5 seconds of single leg stance without loss of balance to improve ability to get in and out of shower safely.    Time 4    Period Weeks    Status New    Target Date 09/22/20             PT Long Term Goals - 08/25/20 1050      PT LONG TERM GOAL #1   Title Patient will increase Functional Gait Assessment score to >20/30 as to reduce fall risk and improve dynamic gait safety with community ambulation.    Time 8    Period Weeks    Status New    Target Date 10/20/20      PT LONG TERM GOAL #2   Title Patient will complete 5 times sit<>Stand in <15 sec without posterior loss of balance to exhibit improvement in transfers at home and reduce fall risk.    Time 8    Period Weeks    Status New    Target Date 10/20/20      PT LONG TERM GOAL #3   Title Patient will improve FOTO score to >78% to indicate improved functional mobility at home and in community.    Time 8    Period Weeks    Status New    Target Date 10/20/20                 Plan - 08/31/20 1104    Clinical Impression Statement Patient motivated and participated well within session. She was instructed in advanced balance exercise for HEP with static and dynamic balance activities.  Patient provided with written HEP  for better adherence with instruction to hold onto counter as needed for safety; Patient does require min A when standing on compliant surface with narrow base of support/reaching outside base of support. She also required min A with eccentric control on resisted walking. Patient would benefit from additional skilled PT Intervention to improve strength, balance and mobility;    Personal Factors and Comorbidities Comorbidity 3+;Time since onset of injury/illness/exacerbation    Comorbidities hard of hearing (wears hearing aids), HTN, osteopenia, recent fall history;    Examination-Activity Limitations Other   walking on unlevel surfaces   Examination-Participation Restrictions Community Activity;Yard Work    Stability/Clinical Decision Making Stable/Uncomplicated    Rehab Potential Good    PT Frequency 1x / week    PT Duration 8 weeks    PT Treatment/Interventions Cryotherapy;Moist Heat;Therapeutic activities;Therapeutic exercise;Balance training;Neuromuscular re-education;Functional mobility training;Patient/family education;Gait training;Stair training    PT Next Visit Plan initiate HEP, work on dynamic balance    PT Home Exercise Plan will address next session    Consulted and Agree with Plan of Care Patient           Patient will benefit from skilled therapeutic intervention in order to improve the following deficits and impairments:  Abnormal gait,Decreased balance,Decreased coordination,Decreased safety awareness  Visit Diagnosis: Unsteadiness on feet  History of falling     Problem List Patient Active Problem List   Diagnosis Date Noted  . High serum vitamin D 08/25/2020  . History of stroke 07/21/2020  . Injury of left shoulder 07/21/2020  . Mallet finger of left hand 04/16/2020  . Acute pain of right knee 02/25/2019  . Osteopenia 08/05/2018  . Onychomycosis 08/05/2018  . Falls 09/24/2017  . Lung nodule seen on imaging study 03/23/2017   . Hypertension 02/09/2017  . Obesity (BMI 30.0-34.9) 02/09/2017  . Memory difficulty 02/09/2017  . Epiretinal membrane 02/09/2017    Saramarie Stinger PT, DPT 08/31/2020, 11:06 AM   [facility] MAIN [facility] SERVICES [address] [place], [place], [address] Phone: [phone]   Fax:  [phone]  Name: Anna-Marie Coller MRN: [id number] Date of Birth: 1930/11/26

## 2020-08-31 NOTE — Patient Instructions (Signed)
Access Code: [email or web address] URL: [email or web address] Date: 08/31/2020 Prepared by: Zettie Pho  Exercises Standing Single Leg Stance with Counter Support - 1 x daily - 7 x weekly - 1 sets - 3-5 reps - 5 sec hold Half Tandem Stance Balance with Head Rotation - 1 x daily - 7 x weekly - 1 sets - 5 reps - 2 sec hold Backward Walking with Counter Support - 1 x daily - 7 x weekly - 1 sets - 3-5 reps Cross-Over Stepping (BKA) - 1 x daily - 7 x weekly - 1 sets - 3-5 reps

## 2020-09-01 ENCOUNTER — Encounter: Payer: Self-pay | Admitting: Family Medicine

## 2020-09-01 ENCOUNTER — Other Ambulatory Visit: Payer: Self-pay

## 2020-09-01 ENCOUNTER — Telehealth: Payer: Self-pay | Admitting: *Deleted

## 2020-09-01 ENCOUNTER — Ambulatory Visit (INDEPENDENT_AMBULATORY_CARE_PROVIDER_SITE_OTHER): Payer: Medicare Other | Admitting: Family Medicine

## 2020-09-01 VITALS — BP 140/80 | HR 80 | Temp 98.4°F | Ht 59.0 in | Wt 122.4 lb

## 2020-09-01 DIAGNOSIS — I4891 Unspecified atrial fibrillation: Secondary | ICD-10-CM | POA: Insufficient documentation

## 2020-09-01 DIAGNOSIS — I499 Cardiac arrhythmia, unspecified: Secondary | ICD-10-CM

## 2020-09-01 NOTE — Telephone Encounter (Signed)
Please place future orders for lab appt.  

## 2020-09-01 NOTE — Assessment & Plan Note (Addendum)
Noted on prior carotid Doppler.  EKG today with possible irregular rhythm though it is difficult to tell if there are truly any P waves or not.  Could represent PACs mixed in with sinus rhythm versus atrial fibrillation.  Message sent to Dr Kirke Corin to review who felt this was likely related to sinus rhythm with PACs.  Discussed with patient.  No further work-up needed.

## 2020-09-01 NOTE — Patient Instructions (Signed)
Nice to see you. Cardiology reviewed your EKGs and felt that you are having extra heartbeats.  If you start to notice any palpitations or other symptoms please let us know.

## 2020-09-01 NOTE — Progress Notes (Addendum)
  Marikay Alar, MD Phone: [phone]  Gail Pierce is a 86 y.o. female who presents today for f/u.  Irregular heartbeat: This was noted on carotid Doppler.  She is brought in today to have an EKG done.  She notes no palpitations, lightheadedness, chest pain, or shortness of breath.  She notes she feels well overall.  Social History   Tobacco Use  Smoking Status Former Smoker  Smokeless Tobacco Never Used     ROS see history of present illness  Objective  Physical Exam Vitals:   09/01/20 1516  BP: 140/80  Pulse: 80  Temp: 98.4 F (36.9 C)  SpO2: 98%    BP Readings from Last 3 Encounters:  09/01/20 140/80  08/25/20 130/70  07/21/20 140/80   Wt Readings from Last 3 Encounters:  09/01/20 122 lb 6.4 oz (55.5 kg)  08/25/20 122 lb 12.8 oz (55.7 kg)  07/21/20 121 lb 9.6 oz (55.2 kg)    Physical Exam Constitutional:      General: She is not in acute distress.    Appearance: She is not diaphoretic.  Cardiovascular:     Rate and Rhythm: Normal rate. Rhythm irregularly irregular.     Heart sounds: Normal heart sounds.  Pulmonary:     Effort: Pulmonary effort is normal.     Breath sounds: Normal breath sounds.  Musculoskeletal:        General: No edema.  Skin:    General: Skin is warm and dry.  Neurological:     Mental Status: She is alert.      EKG:          Assessment/Plan: Please see individual problem list.  Problem List Items Addressed This Visit    Irregular heartbeat - Primary    Noted on prior carotid Doppler.  EKG today with possible irregular rhythm though it is difficult to tell if there are truly any P waves or not.  Could represent PACs mixed in with sinus rhythm versus atrial fibrillation.  Message sent to Dr Kirke Corin to review who felt this was likely related to sinus rhythm with PACs.  Discussed with patient.  No further work-up needed.      Relevant Orders   EKG 12-Lead       This visit occurred during the SARS-CoV-2 public  health emergency.  Safety protocols were in place, including screening questions prior to the visit, additional usage of staff PPE, and extensive cleaning of exam room while observing appropriate contact time as indicated for disinfecting solutions.    Marikay Alar, MD [facility] Primary Care [facility]

## 2020-09-06 ENCOUNTER — Other Ambulatory Visit: Payer: Self-pay

## 2020-09-06 ENCOUNTER — Other Ambulatory Visit (INDEPENDENT_AMBULATORY_CARE_PROVIDER_SITE_OTHER): Payer: Medicare Other

## 2020-09-06 ENCOUNTER — Ambulatory Visit: Payer: Medicare Other | Admitting: Physical Therapy

## 2020-09-06 ENCOUNTER — Encounter: Payer: Self-pay | Admitting: Physical Therapy

## 2020-09-06 ENCOUNTER — Telehealth: Payer: Self-pay

## 2020-09-06 DIAGNOSIS — I10 Essential (primary) hypertension: Secondary | ICD-10-CM | POA: Diagnosis not present

## 2020-09-06 DIAGNOSIS — T452X1A Poisoning by vitamins, accidental (unintentional), initial encounter: Secondary | ICD-10-CM | POA: Diagnosis not present

## 2020-09-06 DIAGNOSIS — R2681 Unsteadiness on feet: Secondary | ICD-10-CM | POA: Diagnosis not present

## 2020-09-06 DIAGNOSIS — Z9181 History of falling: Secondary | ICD-10-CM

## 2020-09-06 LAB — COMPREHENSIVE METABOLIC PANEL
ALT: 20 U/L (ref 0–35)
AST: 24 U/L (ref 0–37)
Albumin: 4.4 g/dL (ref 3.5–5.2)
Alkaline Phosphatase: 55 U/L (ref 39–117)
BUN: 11 mg/dL (ref 6–23)
CO2: 27 mEq/L (ref 19–32)
Calcium: 10.4 mg/dL (ref 8.4–10.5)
Chloride: 105 mEq/L (ref 96–112)
Creatinine, Ser: 0.71 mg/dL (ref 0.40–1.20)
GFR: 76.64 mL/min (ref >60.00)
Glucose, Bld: 106 mg/dL — ABNORMAL HIGH (ref 70–99)
Potassium: 4.2 mEq/L (ref 3.5–5.1)
Sodium: 139 mEq/L (ref 135–145)
Total Bilirubin: 0.6 mg/dL (ref 0.2–1.2)
Total Protein: 7.1 g/dL (ref 6.0–8.3)

## 2020-09-06 LAB — HEPATIC FUNCTION PANEL
ALT: 20 U/L (ref 0–35)
AST: 24 U/L (ref 0–37)
Albumin: 4.4 g/dL (ref 3.5–5.2)
Alkaline Phosphatase: 55 U/L (ref 39–117)
Bilirubin, Direct: 0.1 mg/dL (ref 0.0–0.3)
Total Bilirubin: 0.6 mg/dL (ref 0.2–1.2)
Total Protein: 7.1 g/dL (ref 6.0–8.3)

## 2020-09-06 LAB — VITAMIN D 25 HYDROXY (VIT D DEFICIENCY, FRACTURES): VITD: 92.77 ng/mL (ref 30.00–100.00)

## 2020-09-06 NOTE — Therapy (Signed)
[facility] MAIN [facility] SERVICES [address] [place], [place], [address] Phone: [phone]   Fax:  [phone]  Physical Therapy Treatment  Patient Details  Name: Gail Pierce MRN: [id number] Date of Birth: 04/06/1931 Referring Provider (PT): Jenne Campus MD   Encounter Date: 09/06/2020   PT End of Session - 09/06/20 1014    Visit Number 3    Number of Visits 9    Date for PT Re-Evaluation 10/20/20    Authorization Type Medicare, $35 copay    PT Start Time 1015    PT Stop Time 1100    PT Time Calculation (min) 45 min    Equipment Utilized During Treatment Gait belt    Activity Tolerance Patient tolerated treatment well    Behavior During Therapy WFL for tasks assessed/performed           Past Medical History:  Diagnosis Date   Allergy    Hx of diverticulitis of colon    Hypertension     Past Surgical History:  Procedure Laterality Date   BREAST BIOPSY Left 1980   benign   SEPTOPLASTY     TONSILLECTOMY      There were no vitals filed for this visit.   Subjective Assessment - 09/06/20 1023    Subjective Patient reports doing well; no pain; She reports feeling like her left leg is shorter than her right leg;    Patient is accompained by: Family member   daughter, selinda;   Pertinent History 86 yo Female reports increased history of falls; She presents to therapy with SPC. She reports her unsteadiness is worst outside in the yard. She denies any falls or unsteadiness inside her home. She reports she walks daily (2-3 miles with her neighbor) depending on the weather. Patient reports the sidewalks are not always in good shape and she will have to walk in the street. She said her last fall she was in the street and her foot tripped on the edge of the sidewalk, about 2 months ago. Daughter reports that most falls occur when there is a distraction. She has a rollator which she is using now when walking the neighborhood and hasn't  had a fall since; She reports using the [facility] when she runs errands such as to the store/doctor etc; Pt is still driving; She denies any dizziness; She denies any numbness/tingling;    How long can you sit comfortably? NA    How long can you stand comfortably? 30+ min, no difficulty    How long can you walk comfortably? 2-3 miles, depending on the weather;    Diagnostic tests none    Patient Stated Goals "Improve safety awareness, reduce falls; she is okay using walker right now."- improve foot clearance when walking;    Currently in Pain? No/denies    Multiple Pain Sites No                TREATMENT: Warm up on Nustep BUE/BLE level 2 x4 min (unbilled)  PT assessed leg length, in standing hips are even in height Supine: both legs measure 81cm from ASIS to medial malleolus;  No leg length discrepancy identified;   Standing in parallel bars: Tandem walk on airex beam x4 laps with intermittent HHA Side stepping on airex beam x3 laps each direction with intermittent HHA Standing with feet apart: balloon taps unsupported with intermittent HHA x2 min with increased posterior loss of balance noted;  Standing on 1/2 bolster: -heel/toe raises x15 reps with 2 rail  assist to increase ankle ROM for better ankle strategies -progressed to BUE wand flexion with feet in neutral to challenge ankle strategies x15 reps with min A for safety; Patient required min VCs for balance stability, including to increase trunk control for less loss of balance with smaller base of support    Resisted walking 7.5# forward/backward, side/side (2 way) x2 laps each with cues for weight shift for better dynamic balance control; Required min A for safety; Patient had a harder time with the eccentric return;  Patient tolerated advanced exercise well. She denies any pain; She did require min A when standing on compliant surface with narrow base of support. Patient also had difficulty when reaching/turning head  outside base of support with lateral loss of balance; Reinforced HEP                      PT Education - 09/06/20 1014    Education Details balance/HEP    Person(s) Educated Patient    Methods Explanation;Verbal cues    Comprehension Verbalized understanding;Returned demonstration;Verbal cues required;Need further instruction            PT Short Term Goals - 08/25/20 1048      PT SHORT TERM GOAL #1   Title Patient will be adherent to HEP at least 3x a week to improve balance and safety awareness.    Time 4    Period Weeks    Status New    Target Date 09/22/20      PT SHORT TERM GOAL #2   Title Patient will tolerate 5 seconds of single leg stance without loss of balance to improve ability to get in and out of shower safely.    Time 4    Period Weeks    Status New    Target Date 09/22/20             PT Long Term Goals - 08/25/20 1050      PT LONG TERM GOAL #1   Title Patient will increase Functional Gait Assessment score to >20/30 as to reduce fall risk and improve dynamic gait safety with community ambulation.    Time 8    Period Weeks    Status New    Target Date 10/20/20      PT LONG TERM GOAL #2   Title Patient will complete 5 times sit<>Stand in <15 sec without posterior loss of balance to exhibit improvement in transfers at home and reduce fall risk.    Time 8    Period Weeks    Status New    Target Date 10/20/20      PT LONG TERM GOAL #3   Title Patient will improve FOTO score to >78% to indicate improved functional mobility at home and in community.    Time 8    Period Weeks    Status New    Target Date 10/20/20                 Plan - 09/06/20 1302    Clinical Impression Statement Patient motivated and participated well within session. She was instructed in advanced balance exercise. She does have difficulty keeping balance while in a narrow stance on compliant surfaces especially with reduced rail assist. Patient also  required min A for balance with resisted walking having difficulty shifting weight against resistance. Patient would benefit from additional skilled PT Intervention to improve balance and mobility; Plan to advance HEP next session;    Personal Factors and  Comorbidities Comorbidity 3+;Time since onset of injury/illness/exacerbation    Comorbidities hard of hearing (wears hearing aids), HTN, osteopenia, recent fall history;    Examination-Activity Limitations Other   walking on unlevel surfaces   Examination-Participation Restrictions Community Activity;Yard Work    Stability/Clinical Decision Making Stable/Uncomplicated    Rehab Potential Good    PT Frequency 1x / week    PT Duration 8 weeks    PT Treatment/Interventions Cryotherapy;Moist Heat;Therapeutic activities;Therapeutic exercise;Balance training;Neuromuscular re-education;Functional mobility training;Patient/family education;Gait training;Stair training    PT Next Visit Plan initiate HEP, work on dynamic balance    PT Home Exercise Plan will address next session    Consulted and Agree with Plan of Care Patient           Patient will benefit from skilled therapeutic intervention in order to improve the following deficits and impairments:  Abnormal gait,Decreased balance,Decreased coordination,Decreased safety awareness  Visit Diagnosis: Unsteadiness on feet  History of falling     Problem List Patient Active Problem List   Diagnosis Date Noted   Irregular heartbeat 09/01/2020   High serum vitamin D 08/25/2020   History of stroke 07/21/2020   Injury of left shoulder 07/21/2020   Mallet finger of left hand 04/16/2020   Acute pain of right knee 02/25/2019   Osteopenia 08/05/2018   Onychomycosis 08/05/2018   Falls 09/24/2017   Lung nodule seen on imaging study 03/23/2017   Hypertension 02/09/2017   Obesity (BMI 30.0-34.9) 02/09/2017   Memory difficulty 02/09/2017   Epiretinal membrane 02/09/2017     Terel Bann PT, DPT 09/06/2020, 1:03 PM  [facility] MAIN [facility] SERVICES [address] [place], [place], [address] Phone: [phone]   Fax:  [phone]  Name: Gail Pierce MRN: [id number] Date of Birth: 1931/09/04

## 2020-09-07 ENCOUNTER — Telehealth: Payer: Self-pay

## 2020-09-07 ENCOUNTER — Ambulatory Visit
Admission: RE | Admit: 2020-09-07 | Discharge: 2020-09-07 | Disposition: A | Discharge status: Home / Self Care | Payer: Medicare Other | Source: Ambulatory Visit | Attending: Family Medicine | Admitting: Family Medicine

## 2020-09-07 ENCOUNTER — Telehealth: Payer: Self-pay | Admitting: Family Medicine

## 2020-09-07 DIAGNOSIS — I480 Paroxysmal atrial fibrillation: Secondary | ICD-10-CM

## 2020-09-07 DIAGNOSIS — I340 Nonrheumatic mitral (valve) insufficiency: Secondary | ICD-10-CM | POA: Insufficient documentation

## 2020-09-07 DIAGNOSIS — Z8673 Personal history of transient ischemic attack (TIA), and cerebral infarction without residual deficits: Secondary | ICD-10-CM | POA: Diagnosis not present

## 2020-09-07 DIAGNOSIS — I10 Essential (primary) hypertension: Secondary | ICD-10-CM | POA: Diagnosis not present

## 2020-09-07 LAB — ECHOCARDIOGRAM COMPLETE
AR max vel: 1.2 cm2
AV Area VTI: 2.46 cm2
AV Area mean vel: 2.04 cm2
AV Mean grad: 3.5 mmHg
AV Peak grad: 6.4 mmHg
Ao pk vel: 1.26 m/s
Area-P 1/2: 3.39 cm2
S' Lateral: 2.41 cm

## 2020-09-07 NOTE — Telephone Encounter (Signed)
Please let the patient know that the cardiologist reading her echo felt as though she was in atrial fibrillation on the rhythm strip.  This could be a contributing factor to the stroke that was seen on her prior imaging.  Given this finding she would benefit from a blood thinner to help reduce her risk of having a stroke in the future.  I would like to start her on Eliquis.  I would also like to refer her to cardiology for further evaluation and management.

## 2020-09-07 NOTE — Telephone Encounter (Signed)
-----   Message from Glori Luis, MD sent at 09/07/2020 10:35 AM EST ----- Please let the patient know her vitamin D level is acceptable.  She needs to remain off of vitamin D.  Her glucose is minimally elevated.  Please see if she was fasting for these tests.  Her other labs are acceptable.  Thanks.

## 2020-09-07 NOTE — Progress Notes (Signed)
*  PRELIMINARY RESULTS* Echocardiogram 2D Echocardiogram has been performed.  Cristela Blue 09/07/2020, 11:48 AM

## 2020-09-07 NOTE — Telephone Encounter (Signed)
Lvm for the patient to call back.  Sarika Baldini,cma  

## 2020-09-08 NOTE — Telephone Encounter (Signed)
LVM for the patients daughter to call back for results of echo.  Sherrel Ploch,cma

## 2020-09-09 ENCOUNTER — Ambulatory Visit: Payer: Medicare Other | Admitting: Physical Therapy

## 2020-09-13 ENCOUNTER — Ambulatory Visit: Payer: Medicare Other | Admitting: Physical Therapy

## 2020-09-13 NOTE — Telephone Encounter (Signed)
Returning call for results of Echo.

## 2020-09-13 NOTE — Telephone Encounter (Signed)
Patient informed and verbalized understanding.  She is agreeable to medication and referral to Cardiology.

## 2020-09-13 NOTE — Telephone Encounter (Signed)
Pt called again to get results  Please call [phone]

## 2020-09-14 MED ORDER — APIXABAN 2.5 MG PO TABS: 2.5000 mg | ORAL_TABLET | Freq: Two times a day (BID) | ORAL | 5 refills | Status: DC

## 2020-09-14 NOTE — Telephone Encounter (Signed)
Eliquis sent to pharmacy.  This was dosed based on her weight and age.  She can discontinue the aspirin.  The Eliquis does increase her risk of bleeding and if she ever has any bleeding that she cannot get stopped within 10 to 15 minutes she needs to call 911.  I have also placed a referral to cardiology.  If the Eliquis is excessively expensive she should let us know.

## 2020-09-14 NOTE — Addendum Note (Signed)
Addended by: Glori Luis on: 09/14/2020 12:38 PM   Modules accepted: Orders

## 2020-09-15 ENCOUNTER — Ambulatory Visit: Payer: Medicare Other | Admitting: Physical Therapy

## 2020-09-20 ENCOUNTER — Ambulatory Visit: Payer: Medicare Other

## 2020-09-22 ENCOUNTER — Ambulatory Visit: Payer: Medicare Other | Admitting: Physical Therapy

## 2020-09-27 ENCOUNTER — Ambulatory Visit: Payer: Medicare Other | Admitting: Physical Therapy

## 2020-09-27 NOTE — Telephone Encounter (Signed)
Labs done.  ng 

## 2020-09-29 ENCOUNTER — Ambulatory Visit: Payer: Medicare Other | Admitting: Physical Therapy

## 2020-10-04 ENCOUNTER — Ambulatory Visit: Payer: Medicare Other | Admitting: Physical Therapy

## 2020-10-05 ENCOUNTER — Telehealth: Payer: Self-pay | Admitting: Family Medicine

## 2020-10-05 NOTE — Telephone Encounter (Signed)
Called and LVM for the patien to call her cardiologist to schedule a appointment.  Karsten Howry,cma

## 2020-10-05 NOTE — Telephone Encounter (Signed)
Please call the patient and let her know that cardiology has been trying to contact her to get her scheduled for evaluation of her atrial fibrillation. She needs to call them to set up the appointment.

## 2020-10-06 ENCOUNTER — Ambulatory Visit: Payer: Medicare Other | Admitting: Physical Therapy

## 2020-10-18 ENCOUNTER — Ambulatory Visit: Payer: Medicare Other | Admitting: Cardiology

## 2020-10-18 ENCOUNTER — Encounter: Payer: Self-pay | Admitting: Cardiology

## 2020-10-18 ENCOUNTER — Other Ambulatory Visit: Payer: Self-pay

## 2020-10-18 VITALS — BP 172/82 | HR 61 | Ht 59.0 in | Wt 127.0 lb

## 2020-10-18 DIAGNOSIS — E7800 Pure hypercholesterolemia, unspecified: Secondary | ICD-10-CM

## 2020-10-18 DIAGNOSIS — I4819 Other persistent atrial fibrillation: Secondary | ICD-10-CM

## 2020-10-18 DIAGNOSIS — I10 Essential (primary) hypertension: Secondary | ICD-10-CM

## 2020-10-18 NOTE — Progress Notes (Signed)
Cardiology Office Note:    Date:  10/18/2020   ID:  Gail Pierce, DOB May 25, 1931, MRN [id number]  PCP:  Glori Luis, MD  [facility] Cardiologist:  No primary care provider on file.  [facility] Electrophysiologist:  None   Referring MD: Glori Luis, MD   Chief Complaint  Patient presents with  . New Patient (Initial Visit)    Referred by PCP for Afib. Meds reviewed verbally with patient.     History of Present Illness:    Gail Pierce is a 87 y.o. female with a hx of hypertension, stroke who presents due to A. Fib.  Patient has a history of stroke, heart echocardiogram performed on 08/2020 due to history of stroke.  Rhythm strip during echo suggests atrial fibrillation.  EF was normal, 60 to 65%, mild LVH normal LV size.  She was started on Eliquis 2.5 mg twice daily for stroke prophylaxis.  She denies chest pain, shortness of breath, palpitations, dizziness.  She had a stroke back in August with symptoms of slurred speech, had a head CT in October 2021 showing old infarct.  She has a wobbly gait, uses a Youth worker.  Her blood pressure at home typically runs in the 130s to 150s systolic.  Past Medical History:  Diagnosis Date  . Allergy   . Hx of diverticulitis of colon   . Hypertension     Past Surgical History:  Procedure Laterality Date  . BREAST BIOPSY Left 1980   benign  . SEPTOPLASTY    . TONSILLECTOMY      Current Medications: Current Meds  Medication Sig  . alendronate (FOSAMAX) 70 MG tablet Take 1 tablet (70 mg total) by mouth every 7 (seven) days. Take with a full glass of water on an empty stomach.  Marland Kitchen amLODipine (NORVASC) 5 MG tablet Take 2 tablets (10 mg total) by mouth daily.  Marland Kitchen apixaban (ELIQUIS) 2.5 MG TABS tablet Take 1 tablet (2.5 mg total) by mouth 2 (two) times daily.  . Ascorbic Acid (VITAMIN C) 100 MG tablet Take 100 mg by mouth daily.  . Calcium Carbonate-Vitamin D (CALCIUM 500/D PO) Take by mouth.  .  Cholecalciferol (VITAMIN D3) 1000 units CAPS Take by mouth.  . ciclopirox (PENLAC) 8 % solution ciclopirox 8 % topical solution  APPLY TOPICALLY NIGHTLY OVER NAIL AND SURROUNDING SKIN.  Marland Kitchen Corn Dextrin (EASY FIBER PO) Take by mouth.  . Cyanocobalamin 2500 MCG CHEW Chew by mouth.  . DHA-EPA-Flaxseed Oil-Vitamin E (THERA TEARS NUTRITION PO) Take by mouth.  . Glucosamine-Chondroit-Vit C-Mn (GLUCOSAMINE 1500 COMPLEX PO) Take by mouth.  . Influenza Vac High-Dose Quad (FLUZONE HIGH-DOSE QUADRIVALENT) 0.7 ML SUSY Fluzone High-Dose Quad 2020-21 (PF) 240 mcg/0.7 mL IM syringe  . Influenza vac split quadrivalent PF (FLUZONE HIGH-DOSE) 0.5 ML injection Fluzone High-Dose 2019-20 (PF) 180 mcg/0.5 mL intramuscular syringe  PHARMACIST ADMINISTERED IMMUNIZATION ADMINISTERED AT TIME OF DISPENSING  . Multiple Vitamins-Minerals (CENTRUM ADULTS PO) Take by mouth.  . rosuvastatin (CRESTOR) 10 MG tablet Take 1 tablet (10 mg total) by mouth daily.  . vitamin A 8000 UNIT capsule Take 8,000 Units by mouth daily.     Allergies:   Patient has no known allergies.   Social History   Socioeconomic History  . Marital status: Widowed    Spouse name: Not on file  . Number of children: Not on file  . Years of education: Not on file  . Highest education level: Not on file  Occupational History  . Not on file  Tobacco Use  . Smoking status: Former Games developer  . Smokeless tobacco: Never Used  Vaping Use  . Vaping Use: Never used  Substance and Sexual Activity  . Alcohol use: Yes    Comment: OCC  . Drug use: No  . Sexual activity: Not on file  Other Topics Concern  . Not on file  Social History Narrative  . Not on file   Social Determinants of Health   Financial Resource Strain: Not on file  Food Insecurity: Not on file  Transportation Needs: Not on file  Physical Activity: Not on file  Stress: Not on file  Social Connections: Moderately Isolated  . Frequency of Communication with Friends and Family: More  than three times a week  . Frequency of Social Gatherings with Friends and Family: More than three times a week  . Attends Religious Services: Never  . Active Member of Clubs or Organizations: Yes  . Attends Banker Meetings: 1 to 4 times per year  . Marital Status: Widowed     Family History: The patient's family history includes Heart attack in her father; Leukemia in her mother.  ROS:   Please see the history of present illness.     All other systems reviewed and are negative.  EKGs/Labs/Other Studies Reviewed:    The following studies were reviewed today:   EKG:  EKG is  ordered today.  The ekg ordered today demonstrates A. fib ablation, heart rate 61  Recent Labs: 09/06/2020: ALT 20; ALT 20; BUN 11; Creatinine, Ser 0.71; Potassium 4.2; Sodium 139  Recent Lipid Panel    Component Value Date/Time   CHOL 163 07/21/2020 0956   TRIG 93.0 07/21/2020 0956   HDL 66.90 07/21/2020 0956   CHOLHDL 2 07/21/2020 0956   VLDL 18.6 07/21/2020 0956   LDLCALC 78 07/21/2020 0956   LDLDIRECT 40.0 08/09/2020 0957     Risk Assessment/Calculations:      Physical Exam:    VS:  BP (!) 172/82 (BP Location: Right Arm, Patient Position: Sitting, Cuff Size: Normal)   Pulse 61   Ht 4\' 11"  (1.499 m)   Wt 127 lb (57.6 kg)   SpO2 97%   BMI 25.65 kg/m     Wt Readings from Last 3 Encounters:  10/18/20 127 lb (57.6 kg)  09/01/20 122 lb 6.4 oz (55.5 kg)  08/25/20 122 lb 12.8 oz (55.7 kg)     GEN:  Well nourished, well developed in no acute distress HEENT: Normal NECK: No JVD; No carotid bruits LYMPHATICS: No lymphadenopathy CARDIAC: Irregular irregular, nontachycardic RESPIRATORY:  Clear to auscultation without rales, wheezing or rhonchi  ABDOMEN: Soft, non-tender, non-distended MUSCULOSKELETAL:  No edema; No deformity  SKIN: Warm and dry NEUROLOGIC:  Alert and oriented x 3 PSYCHIATRIC:  Normal affect   ASSESSMENT:    1. Persistent atrial fibrillation (HCC)   2.  Primary hypertension   3. Pure hypercholesterolemia    PLAN:    In order of problems listed above:  1. Persistent atrial fibrillation, CHA2DS2-VASc 5 (htn, age, gender, stroke).  Echocardiogram with preserved EF, normal LA size.  Patient is asymptomatic, heart rate controlled, currently in A. fib.  Rate control strategy will be pursued.  Eliquis 2.5 mg twice daily. 2. Hypertension, BP elevated today, usually controlled.  Continue amlodipine.  Monitor BP at home, low-salt diet advised. 3. Hyperlipidemia, on statin.  Follow-up in 6 months   Medication Adjustments/Labs and Tests Ordered: Current medicines are reviewed at length with the patient today.  Concerns  regarding medicines are outlined above.  Orders Placed This Encounter  Procedures  . EKG 12-Lead   No orders of the defined types were placed in this encounter.   Patient Instructions  Medication Instructions:  Your physician recommends that you continue on your current medications as directed. Please refer to the Current Medication list given to you today. *If you need a refill on your cardiac medications before your next appointment, please call your pharmacy*   Lab Work: None ordered If you have labs (blood work) drawn today and your tests are completely normal, you will receive your results only by: Marland Kitchen MyChart Message (if you have MyChart) OR . A paper copy in the mail If you have any lab test that is abnormal or we need to change your treatment, we will call you to review the results.   Testing/Procedures: None ordered   Follow-Up: At [facility], you and your health needs are our priority.  As part of our continuing mission to provide you with exceptional heart care, we have created designated Provider Care Teams.  These Care Teams include your primary Cardiologist (physician) and Advanced Practice Providers (APPs -  Physician Assistants and Nurse Practitioners) who all work together to provide you with the care  you need, when you need it.  We recommend signing up for the patient portal called "MyChart".  Sign up information is provided on this After Visit Summary.  MyChart is used to connect with patients for Virtual Visits (Telemedicine).  Patients are able to view lab/test results, encounter notes, upcoming appointments, etc.  Non-urgent messages can be sent to your provider as well.   To learn more about what you can do with MyChart, go to [email or web address].    Your next appointment:   6 month(s)  The format for your next appointment:   In Person  Provider:   Debbe Odea, MD   Other Instructions      Signed, Debbe Odea, MD  10/18/2020 12:56 PM    [facility]

## 2020-10-18 NOTE — Patient Instructions (Signed)

## 2020-10-31 IMAGING — MG MM DIGITAL SCREENING BILAT W/ TOMO W/ CAD
8 series · 8 of 24 positions shown · non-contrast
Comparison: Previous exam(s).

CLINICAL DATA: Screening.

EXAM:
DIGITAL SCREENING BILATERAL MAMMOGRAM WITH TOMO AND CAD

[L MLO synth-2D]
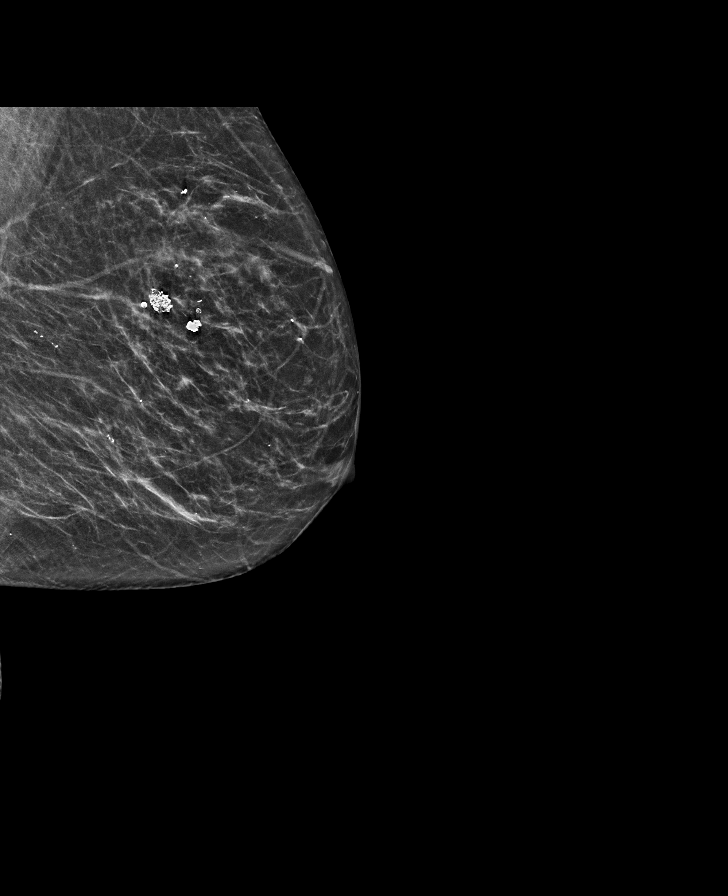

[L CC synth-2D]
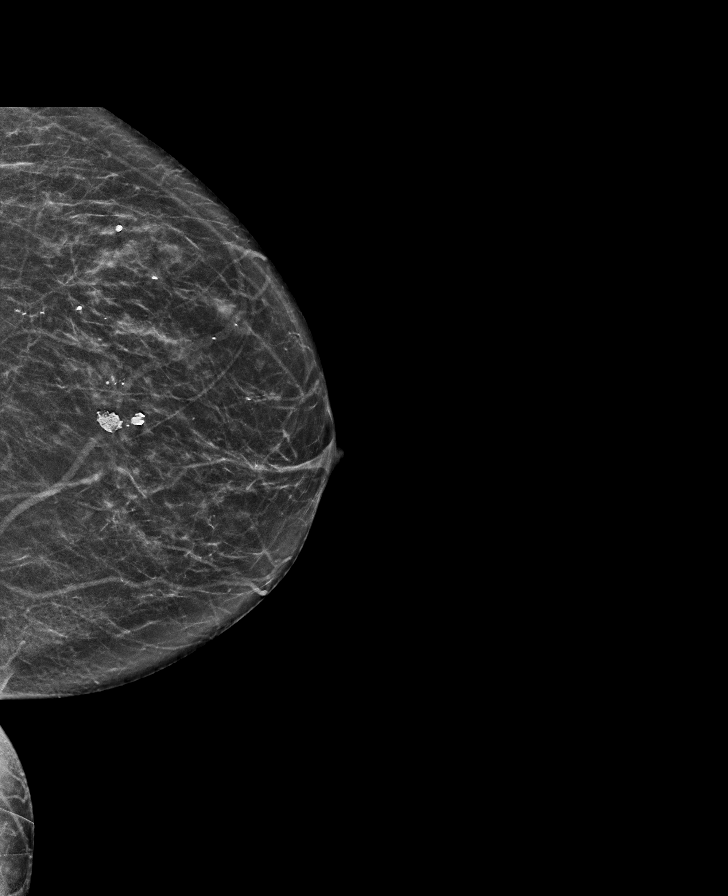

[R CC synth-2D]
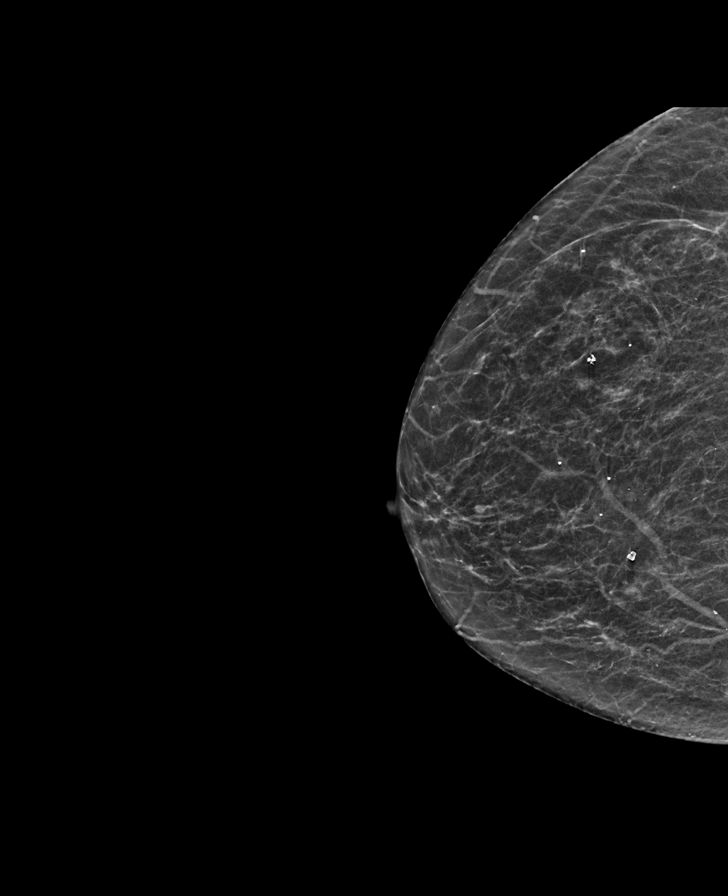

[R MLO synth-2D]
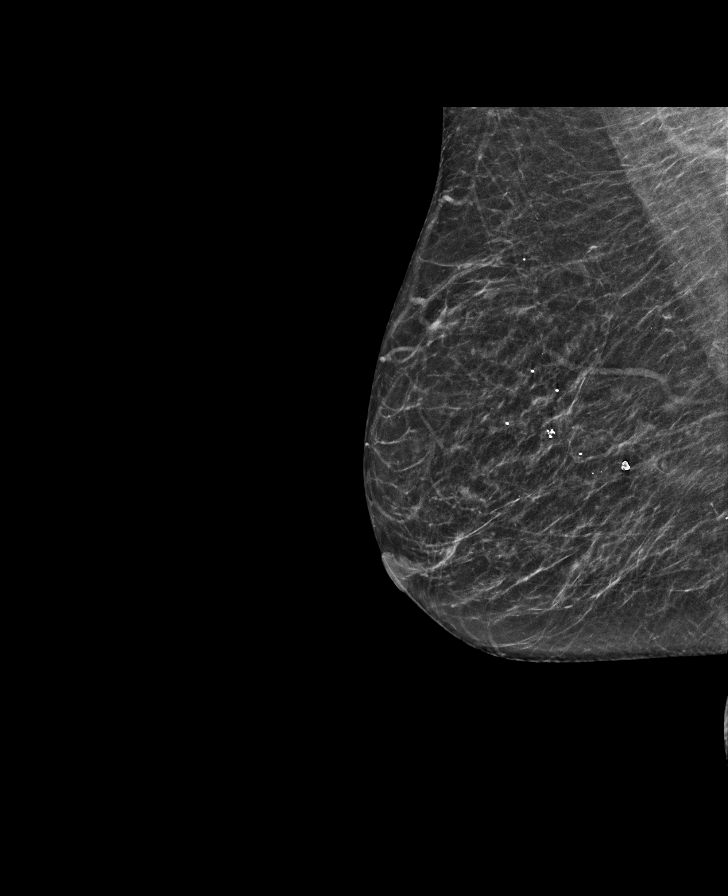

[L MLO tomo · tomo slice 31/60.0]
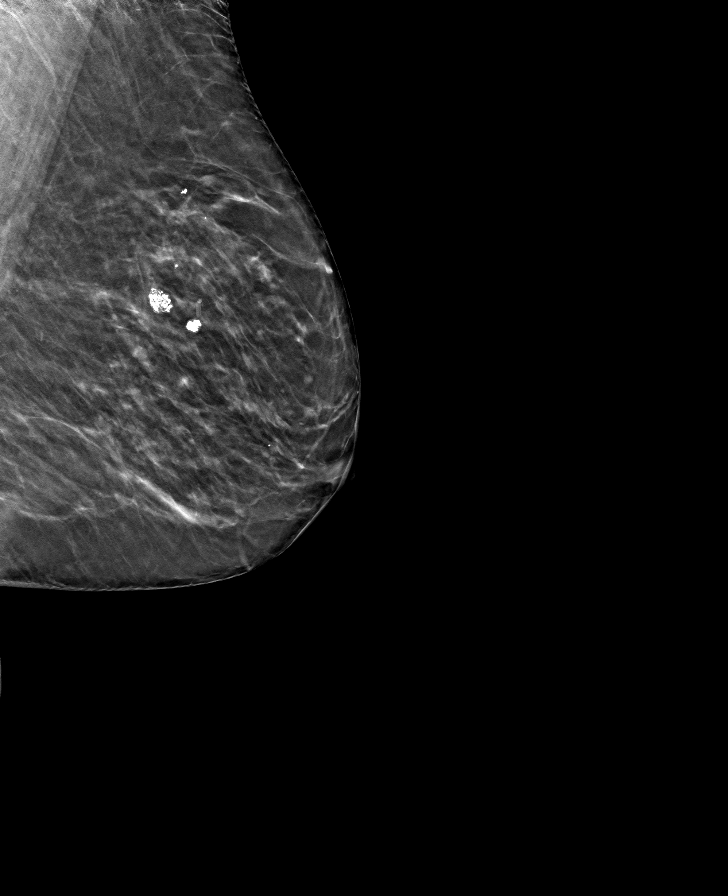

[R MLO tomo · tomo slice 30/59.0]
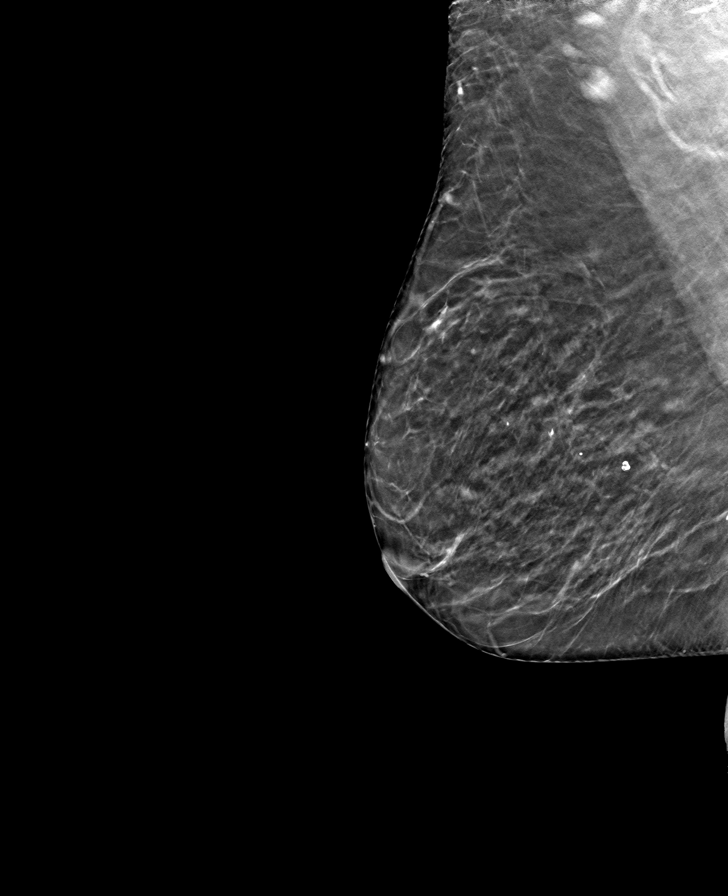

[R CC tomo · tomo slice 27/53.0]
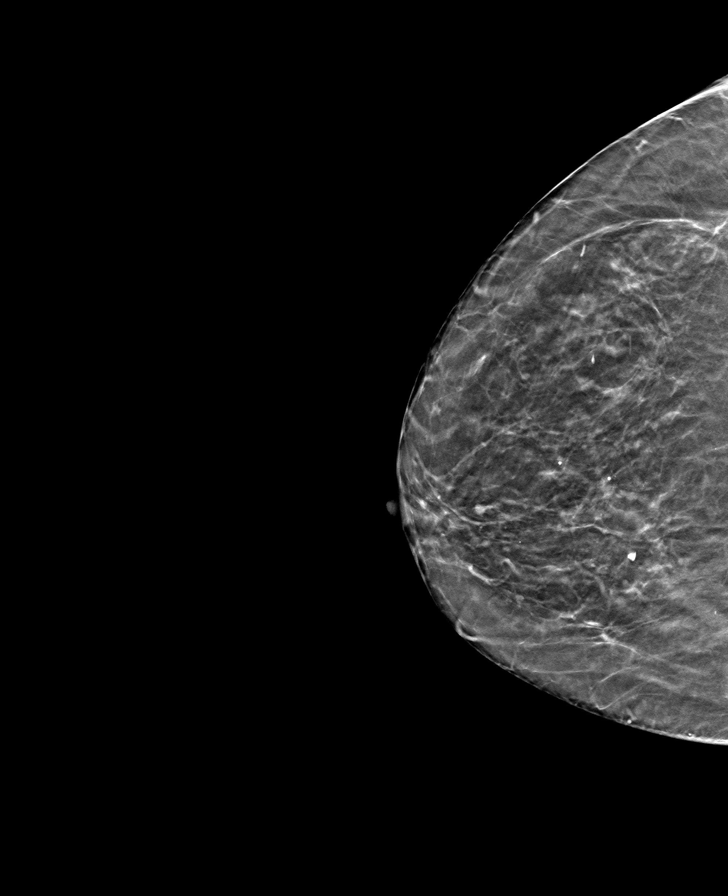

[L CC tomo · tomo slice 29/57.0]
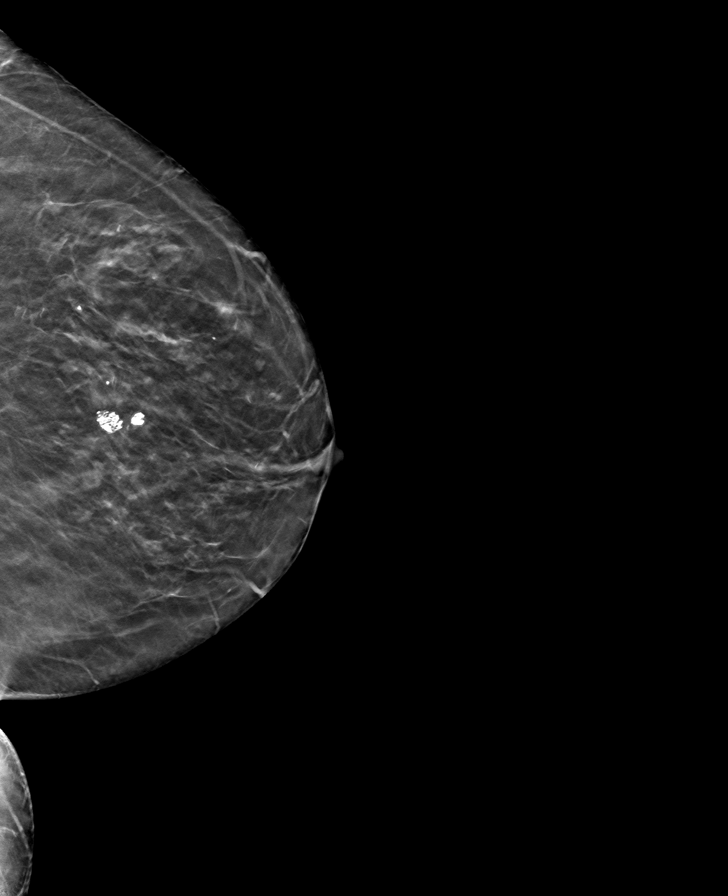

[8 of 24 positions shown; findings below may reference images not displayed]

ACR Breast Density Category b: There are scattered areas of
fibroglandular density.
FINDINGS: There are no findings suspicious for malignancy. Images were
processed with CAD.
IMPRESSION: No mammographic evidence of malignancy. A result letter of this
screening mammogram will be mailed directly to the patient.

RECOMMENDATION:
Screening mammogram in one year. (Code:CN-U-775)

BI-RADS CATEGORY  1: Negative.

## 2020-11-30 ENCOUNTER — Other Ambulatory Visit: Payer: Self-pay

## 2020-11-30 ENCOUNTER — Encounter: Payer: Self-pay | Admitting: Family Medicine

## 2020-11-30 ENCOUNTER — Ambulatory Visit (INDEPENDENT_AMBULATORY_CARE_PROVIDER_SITE_OTHER): Payer: Medicare Other | Admitting: Family Medicine

## 2020-11-30 DIAGNOSIS — G8929 Other chronic pain: Secondary | ICD-10-CM

## 2020-11-30 DIAGNOSIS — M25511 Pain in right shoulder: Secondary | ICD-10-CM

## 2020-11-30 DIAGNOSIS — B351 Tinea unguium: Secondary | ICD-10-CM | POA: Diagnosis not present

## 2020-11-30 DIAGNOSIS — I10 Essential (primary) hypertension: Secondary | ICD-10-CM

## 2020-11-30 DIAGNOSIS — E785 Hyperlipidemia, unspecified: Secondary | ICD-10-CM | POA: Diagnosis not present

## 2020-11-30 DIAGNOSIS — I4821 Permanent atrial fibrillation: Secondary | ICD-10-CM | POA: Diagnosis not present

## 2020-11-30 NOTE — Assessment & Plan Note (Addendum)
Chronic issue.  Started after her fall last year.  She has declined physical therapy and orthopedic referral.  She can use Voltaren gel and Tylenol.  I advised against using oral NSAIDs.  Discussed the increased risk of bleeding with use of oral NSAIDs with an anticoagulant.

## 2020-11-30 NOTE — Assessment & Plan Note (Signed)
Has not responded to ciclopirox.  Discussed that there are not really any other interventions that will be more effective other than oral medication and she has declined this given risk to her liver with that medication.

## 2020-11-30 NOTE — Patient Instructions (Addendum)
Nice to see you. Please continue to monitor your blood pressure.  

## 2020-11-30 NOTE — Assessment & Plan Note (Signed)
Rate controlled.  She will continue on Eliquis 2.5 mg twice daily.

## 2020-11-30 NOTE — Progress Notes (Signed)
Marikay Alar, MD Phone: [phone]  Gail Pierce is a 87 y.o. female who presents today for f/u.  HYPERTENSION  Disease Monitoring  Home BP Monitoring typically a little higher than today Chest pain- no    Dyspnea- no Medications  Compliance-  Taking amlodipine 5 mg daily.   Edema- no  HYPERLIPIDEMIA Symptoms Chest pain on exertion:  no    Medications: Compliance- taking crestor Right upper quadrant pain- no  Muscle aches- no  Atrial fibrillation: Currently on Eliquis 2.5 mg twice daily.  She takes the first dose around 4 AM and the second dose around noon.  No palpitations.  No bleeding issues.  Right shoulder pain: This is an ongoing issue since she fell in the fall last year.  She notes no recent falls.  Notes the shoulder hurts with certain movements.  She is using Voltaren on it.  Also takes Aleve 2 tablets at a time on an occasional basis.  She typically does use a cane or a walker when she is out and about.  Onychomycosis: She is been using ciclopirox for about a year.  She has had no improvement with this.  Reports he previously declined using a oral medication for this.    Social History   Tobacco Use  Smoking Status Former Smoker  Smokeless Tobacco Never Used    Current Outpatient Medications on File Prior to Visit  Medication Sig Dispense Refill  . alendronate (FOSAMAX) 70 MG tablet Take 1 tablet (70 mg total) by mouth every 7 (seven) days. Take with a full glass of water on an empty stomach. 13 tablet 3  . amLODipine (NORVASC) 5 MG tablet Take 2 tablets (10 mg total) by mouth daily. 180 tablet 3  . apixaban (ELIQUIS) 2.5 MG TABS tablet Take 1 tablet (2.5 mg total) by mouth 2 (two) times daily. 60 tablet 5  . Ascorbic Acid (VITAMIN C) 100 MG tablet Take 100 mg by mouth daily.    . Calcium Carbonate-Vitamin D (CALCIUM 500/D PO) Take by mouth.    . Cholecalciferol (VITAMIN D3) 1000 units CAPS Take by mouth.    . ciclopirox (PENLAC) 8 % solution ciclopirox  8 % topical solution  APPLY TOPICALLY NIGHTLY OVER NAIL AND SURROUNDING SKIN.    Marland Kitchen Corn Dextrin (EASY FIBER PO) Take by mouth.    . Cyanocobalamin 2500 MCG CHEW Chew by mouth.    . DHA-EPA-Flaxseed Oil-Vitamin E (THERA TEARS NUTRITION PO) Take by mouth.    . Glucosamine-Chondroit-Vit C-Mn (GLUCOSAMINE 1500 COMPLEX PO) Take by mouth.    . Influenza Vac High-Dose Quad (FLUZONE HIGH-DOSE QUADRIVALENT) 0.7 ML SUSY Fluzone High-Dose Quad 2020-21 (PF) 240 mcg/0.7 mL IM syringe    . Influenza vac split quadrivalent PF (FLUZONE HIGH-DOSE) 0.5 ML injection Fluzone High-Dose 2019-20 (PF) 180 mcg/0.5 mL intramuscular syringe  PHARMACIST ADMINISTERED IMMUNIZATION ADMINISTERED AT TIME OF DISPENSING    . Multiple Vitamins-Minerals (CENTRUM ADULTS PO) Take by mouth.    . rosuvastatin (CRESTOR) 10 MG tablet Take 1 tablet (10 mg total) by mouth daily. 90 tablet 3  . vitamin A 8000 UNIT capsule Take 8,000 Units by mouth daily.     No current facility-administered medications on file prior to visit.     ROS see history of present illness  Objective  Physical Exam Vitals:   11/30/20 0948  BP: 90/60  Pulse: (!) 49  Temp: 98.3 F (36.8 C)  SpO2: 99%    BP Readings from Last 3 Encounters:  11/30/20 90/60  10/18/20 (!) 172/82  09/01/20 140/80   Wt Readings from Last 3 Encounters:  11/30/20 129 lb 9.6 oz (58.8 kg)  10/18/20 127 lb (57.6 kg)  09/01/20 122 lb 6.4 oz (55.5 kg)    Physical Exam Constitutional:      General: She is not in acute distress.    Appearance: She is not diaphoretic.  Cardiovascular:     Rate and Rhythm: Normal rate. Rhythm irregularly irregular.     Heart sounds: Normal heart sounds.  Pulmonary:     Effort: Pulmonary effort is normal.     Breath sounds: Normal breath sounds.  Musculoskeletal:     Comments: Right shoulder with discomfort on active and passive internal range of motion and external range of motion, no discomfort on abduction, positive empty can right  shoulder, full range of motion bilateral shoulders, left shoulder with no discomfort on range of motion, negative empty can on the left  Skin:    General: Skin is warm and dry.  Neurological:     Mental Status: She is alert.      Assessment/Plan: Please see individual problem list.  Problem List Items Addressed This Visit    Atrial fibrillation (HCC)    Rate controlled.  She will continue on Eliquis 2.5 mg twice daily.      Hyperlipidemia    LDL is at goal given history of stroke.  She will continue Crestor 10 mg once daily.      Hypertension    Adequate control.  Continue amlodipine 5 mg once daily.      Onychomycosis    Has not responded to ciclopirox.  Discussed that there are not really any other interventions that will be more effective other than oral medication and she has declined this given risk to her liver with that medication.      Right shoulder pain    Chronic issue.  Started after her fall last year.  She has declined physical therapy and orthopedic referral.  She can use Voltaren gel and Tylenol.  I advised against using oral NSAIDs.  Discussed the increased risk of bleeding with use of oral NSAIDs with an anticoagulant.          This visit occurred during the SARS-CoV-2 public health emergency.  Safety protocols were in place, including screening questions prior to the visit, additional usage of staff PPE, and extensive cleaning of exam room while observing appropriate contact time as indicated for disinfecting solutions.    Marikay Alar, MD [facility]

## 2020-11-30 NOTE — Assessment & Plan Note (Signed)
LDL is at goal given history of stroke.  She will continue Crestor 10 mg once daily.

## 2020-11-30 NOTE — Assessment & Plan Note (Signed)
Adequate control.  Continue amlodipine 5 mg once daily. 

## 2021-02-01 ENCOUNTER — Telehealth: Payer: Self-pay | Admitting: *Deleted

## 2021-02-01 NOTE — Telephone Encounter (Signed)
Message left with patient to remind of appt scheduled for follow up CT scan tomorrow (Wed 5/25) at 10am at [facility]. Informed that visit scheduled with Boneta Lucks, NP on 5/26 is changing to virtual visit and to expect phone call from her rather than coming to see her in person. Contact info left for pt to call back if has any further questions or needs to reschedule.

## 2021-02-02 ENCOUNTER — Ambulatory Visit
Admission: RE | Admit: 2021-02-02 | Discharge: 2021-02-02 | Disposition: A | Discharge status: Home / Self Care | Payer: Medicare Other | Source: Ambulatory Visit | Attending: Oncology | Admitting: Oncology

## 2021-02-02 ENCOUNTER — Other Ambulatory Visit: Payer: Self-pay

## 2021-02-02 DIAGNOSIS — R911 Solitary pulmonary nodule: Secondary | ICD-10-CM | POA: Diagnosis not present

## 2021-02-02 DIAGNOSIS — I517 Cardiomegaly: Secondary | ICD-10-CM | POA: Diagnosis not present

## 2021-02-02 DIAGNOSIS — I700 Atherosclerosis of aorta: Secondary | ICD-10-CM | POA: Diagnosis not present

## 2021-02-02 DIAGNOSIS — J432 Centrilobular emphysema: Secondary | ICD-10-CM | POA: Diagnosis not present

## 2021-02-02 DIAGNOSIS — J929 Pleural plaque without asbestos: Secondary | ICD-10-CM | POA: Diagnosis not present

## 2021-02-03 ENCOUNTER — Inpatient Hospital Stay: Payer: Medicare Other | Attending: Oncology | Admitting: Oncology

## 2021-02-03 DIAGNOSIS — R911 Solitary pulmonary nodule: Secondary | ICD-10-CM

## 2021-02-03 NOTE — Progress Notes (Signed)
Pulmonary Nodule Clinic Consult note [facility]  [phone] [phone]  Patient Care Team: Glori Luis, MD as PCP - General (Family Medicine)   Name of the patient: Gail Pierce  [id number]  24-Sep-1930   Date of visit: 02/03/2021   Diagnosis- Lung Nodule   Chief complaint/ Reason for visit- Pulmonary Nodule Clinic Initial Visit  Past Medical History:  Patient is managed/referred by PCP Dr. Birdie Sons.  Most recent CT scan completed on 01/30/2019 revealed stable bilateral pulmonary nodules but new groundglass opacity in right lower lobe measuring approximately 7 mm.  Adenocarcinoma cannot be excluded.  Previous imaging with CT chest without contrast on 10/10/2017 revealed small bilateral lung nodules including 6 nodules in the left upper lobe, largest measuring 4 mm in size and a 2 mm right middle lobe lung nodule.  Per Fleischner guidelines, CT scan without contrast is recommended in 12 months and if stable repeat CT chest at 18 to 24 months to ensure stability.  CT chest without contrast from 02/02/2020 showed a 6 mm subsolid right lower lobe nodule that remains unchanged.  Repeat CT is recommended every 2 years until 5 years of stability has been established.  Additional small calcified and noncalcified pulmonary nodules measuring up to 5 mm in the left upper lobe remain unchanged and likely benign.  Interval history-patient presents today to review most recent CT scan.  Gail Pierce is a current non-smoker.  She quit approximately 18 years ago.  She smoked for about 10 years.  She does not remember how many cigarettes she smokes daily but it was less than half a pack.  Her husband was a non-smoker so she did not smoke around him.  She is from [place] and moved to [place] when she was 87 years old.  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  She denies any known exposure to toxins known to cause cancer.  She  is widowed.  She lives here locally.  She has a daughter and 2 granddaughters that live close by.  She denies any personal history of cancer.  She denies any family history of cancer.  She is followed closely by her PCP Dr. Birdie Sons.  She was evaluated back in January 2021 for recurrent falls with injury.  She noted injury to her foot and fifth metatarsal of right hand.  She also notes possible loss of consciousness and injury to the  right side of her head.  CT head from 09/16/2019 was negative for acute abnormality.  She denies any recent falls.  Today, she is doing well.  She denies any respiratory concerns.  She is healthy. She does not eat meat.  BP has been stable. Drinks red wine 2 glasses per day. She still drives.  She wears hearing aids.  She is very social and has gatherings with her friends weekly.  She denies any recent fever or illness.  Her appetite is good and she denies any weight loss.  She denies any chest pain, nausea, vomiting, constipation or diarrhea.  ECOG FS:0 - Asymptomatic  Review of systems- Review of Systems  Constitutional: Positive for malaise/fatigue. Negative for chills, fever and weight loss.  HENT: Negative for congestion, ear pain and tinnitus.   Eyes: Negative.  Negative for blurred vision and double vision.  Respiratory: Negative.  Negative for cough, sputum production and shortness of breath.   Cardiovascular: Negative.  Negative for chest pain, palpitations and leg swelling.  Gastrointestinal: Negative.  Negative for  abdominal pain, constipation, diarrhea, nausea and vomiting.  Genitourinary: Negative for dysuria, frequency and urgency.  Musculoskeletal: Negative for back pain and falls.  Skin: Negative.  Negative for rash.  Neurological: Negative.  Negative for weakness and headaches.  Endo/Heme/Allergies: Negative.  Does not bruise/bleed easily.  Psychiatric/Behavioral: Negative.  Negative for depression. The patient is not nervous/anxious and does not  have insomnia.      No Known Allergies   Past Medical History:  Diagnosis Date  . Allergy   . Hx of diverticulitis of colon   . Hypertension      Past Surgical History:  Procedure Laterality Date  . BREAST BIOPSY Left 1980   benign  . SEPTOPLASTY    . TONSILLECTOMY      Social History   Socioeconomic History  . Marital status: Widowed    Spouse name: Not on file  . Number of children: Not on file  . Years of education: Not on file  . Highest education level: Not on file  Occupational History  . Not on file  Tobacco Use  . Smoking status: Former Games developer  . Smokeless tobacco: Never Used  Vaping Use  . Vaping Use: Never used  Substance and Sexual Activity  . Alcohol use: Yes    Comment: OCC  . Drug use: No  . Sexual activity: Not on file  Other Topics Concern  . Not on file  Social History Narrative  . Not on file   Social Determinants of Health   Financial Resource Strain: Not on file  Food Insecurity: Not on file  Transportation Needs: Not on file  Physical Activity: Not on file  Stress: Not on file  Social Connections: Moderately Isolated  . Frequency of Communication with Friends and Family: More than three times a week  . Frequency of Social Gatherings with Friends and Family: More than three times a week  . Attends Religious Services: Never  . Active Member of Clubs or Organizations: Yes  . Attends Banker Meetings: 1 to 4 times per year  . Marital Status: Widowed  Intimate Partner Violence: Not on file    Family History  Problem Relation Age of Onset  . Leukemia Mother   . Heart attack Father      Current Outpatient Medications:  .  alendronate (FOSAMAX) 70 MG tablet, Take 1 tablet (70 mg total) by mouth every 7 (seven) days. Take with a full glass of water on an empty stomach., Disp: 13 tablet, Rfl: 3 .  amLODipine (NORVASC) 5 MG tablet, Take 2 tablets (10 mg total) by mouth daily., Disp: 180 tablet, Rfl: 3 .  apixaban  (ELIQUIS) 2.5 MG TABS tablet, Take 1 tablet (2.5 mg total) by mouth 2 (two) times daily., Disp: 60 tablet, Rfl: 5 .  Ascorbic Acid (VITAMIN C) 100 MG tablet, Take 100 mg by mouth daily., Disp: , Rfl:  .  Calcium Carbonate-Vitamin D (CALCIUM 500/D PO), Take by mouth., Disp: , Rfl:  .  Cholecalciferol (VITAMIN D3) 1000 units CAPS, Take by mouth., Disp: , Rfl:  .  ciclopirox (PENLAC) 8 % solution, ciclopirox 8 % topical solution  APPLY TOPICALLY NIGHTLY OVER NAIL AND SURROUNDING SKIN., Disp: , Rfl:  .  Corn Dextrin (EASY FIBER PO), Take by mouth., Disp: , Rfl:  .  Cyanocobalamin 2500 MCG CHEW, Chew by mouth., Disp: , Rfl:  .  DHA-EPA-Flaxseed Oil-Vitamin E (THERA TEARS NUTRITION PO), Take by mouth., Disp: , Rfl:  .  Glucosamine-Chondroit-Vit C-Mn (GLUCOSAMINE 1500 COMPLEX PO),  Take by mouth., Disp: , Rfl:  .  Influenza Vac High-Dose Quad (FLUZONE HIGH-DOSE QUADRIVALENT) 0.7 ML SUSY, Fluzone High-Dose Quad 2020-21 (PF) 240 mcg/0.7 mL IM syringe, Disp: , Rfl:  .  Influenza vac split quadrivalent PF (FLUZONE HIGH-DOSE) 0.5 ML injection, Fluzone High-Dose 2019-20 (PF) 180 mcg/0.5 mL intramuscular syringe  PHARMACIST ADMINISTERED IMMUNIZATION ADMINISTERED AT TIME OF DISPENSING, Disp: , Rfl:  .  Multiple Vitamins-Minerals (CENTRUM ADULTS PO), Take by mouth., Disp: , Rfl:  .  rosuvastatin (CRESTOR) 10 MG tablet, Take 1 tablet (10 mg total) by mouth daily., Disp: 90 tablet, Rfl: 3 .  vitamin A 8000 UNIT capsule, Take 8,000 Units by mouth daily., Disp: , Rfl:   Physical exam: There were no vitals filed for this visit. Physical Exam Constitutional:      Appearance: Normal appearance.  HENT:     Head: Normocephalic and atraumatic.  Eyes:     Pupils: Pupils are equal, round, and reactive to light.  Cardiovascular:     Rate and Rhythm: Normal rate and regular rhythm.     Heart sounds: Normal heart sounds. No murmur heard.   Pulmonary:     Effort: Pulmonary effort is normal.     Breath sounds: Normal  breath sounds. No wheezing.  Abdominal:     General: Bowel sounds are normal. There is no distension.     Palpations: Abdomen is soft.     Tenderness: There is no abdominal tenderness.  Musculoskeletal:        General: Normal range of motion.     Cervical back: Normal range of motion.  Skin:    General: Skin is warm and dry.     Findings: No rash.  Neurological:     Mental Status: She is alert and oriented to person, place, and time.  Psychiatric:        Judgment: Judgment normal.      CMP Latest Ref Rng & Units 09/06/2020  Glucose 70 - 99 mg/dL -  BUN 6 - 23 mg/dL -  Creatinine 7.35 - 3.29 mg/dL -  Sodium 924 - 268 mEq/L -  Potassium 3.5 - 5.1 mEq/L -  Chloride 96 - 112 mEq/L -  CO2 19 - 32 mEq/L -  Calcium 8.4 - 10.5 mg/dL -  Total Protein 6.0 - 8.3 g/dL 7.1  Total Bilirubin 0.2 - 1.2 mg/dL 0.6  Alkaline Phos 39 - 117 U/L 55  AST 0 - 37 U/L 24  ALT 0 - 35 U/L 20   CBC Latest Ref Rng & Units 08/05/2018  WBC 4.0 - 10.5 K/uL 5.9  Hemoglobin 12.0 - 15.0 g/dL 34.1  Hematocrit 96.2 - 46.0 % 39.1  Platelets 150.0 - 400.0 K/uL 226.0    No images are attached to the encounter.  CT Chest Wo Contrast  Result Date: 02/03/2021 CLINICAL DATA:  Follow-up lung nodule EXAM: CT CHEST WITHOUT CONTRAST TECHNIQUE: Multidetector CT imaging of the chest was performed following the standard protocol without IV contrast. COMPARISON:  02/02/2020 FINDINGS: Cardiovascular: Aortic atherosclerosis. Cardiomegaly. No pericardial effusion. Mediastinum/Nodes: No enlarged mediastinal, hilar, or axillary lymph nodes. Thyroid gland, trachea, and esophagus demonstrate no significant findings. Lungs/Pleura: Mild centrilobular emphysema. Mild, diffuse bilateral bronchial wall thickening. No change in an irregular opacity of the dependent right lower lobe, measuring approximately 0.8 x 0.4 cm (series 3, image 108). Additional small pulmonary nodules scattered throughout, for example a 3 mm nodule of the lateral  segment right middle lobe (series 3, image 58) and a 3 mm nodule  of the left upper lobe (series 3, image 56). No pleural effusion or pneumothorax. Upper Abdomen: No acute abnormality. Musculoskeletal: No chest wall mass or suspicious bone lesions identified. IMPRESSION: 1. No change in an irregular opacity of the dependent right lower lobe, measuring approximately 0.8 x 0.4 cm. This is benign and no further follow-up or characterization is required. 2. Additional small pulmonary nodules scattered throughout are unchanged and likewise definitively benign. No further follow-up or characterization is required. 3. Emphysema. 4. Diffuse bilateral bronchial wall thickening, consistent with nonspecific infectious or inflammatory bronchitis. 5. Cardiomegaly. Aortic Atherosclerosis (ICD10-I70.0) and Emphysema (ICD10-J43.9). Electronically Signed   By: Lauralyn PrimesAlex  Bibbey M.D.   On: 02/03/2021 12:17     Assessment and plan- Patient is a 87 y.o. female who presents to pulmonary nodule clinic for follow-up of incidental lung nodules.  A telephone visit was conducted to review most recent CT scan results.   CT scan from 02/02/21 shows no change in an irregular opacity of the dependent right lower lobe measuring 0.8 x 0.4 cm.  This is benign and no further follow-up or characterization is required.  There are additional small pulmonary nodules scattered throughout that are unchanged and likewise definitively benign.  No further follow-up characterization is required.    Calculating malignancy probability of a pulmonary nodule: Risk factors include: 1.  Age. 2.  Cancer history. 3.  Diameter of pulmonary nodule and mm 4.  Location 5.  Smoking history 6.  Spiculation present   Based on risk factors, this patient is low risk for the development of lung cancer.   During our visit, we discussed pulmonary nodules are a common incidental finding and are often how lung cancer is discovered.  Lung cancer survival is directly  related to the stage at diagnosis.  We discussed that nodules can vary in presentation from solitary pulmonary nodules to masses, 2 groundglass opacities and multiple nodules.  Pulmonary nodules in the majority of cases are benign but the probability of these becoming malignant cannot be undermined.  Early identification of malignant nodules could lead to early diagnosis and increased survival.   We discussed the probability of pulmonary nodules becoming malignant increase with age, pack years of tobacco use, size/characteristics of the nodule and location; with upper lobe involvement being most worrisome.   We discussed the goal of our clinic is to thoroughly evaluate each nodule, developed a comprehensive, individualized plan of care utilizing the most advanced technology and significantly reduce the time from detection to treatment.  A dedicated pulmonary nodule clinic has proven to indeed expedite the detection and treatment of lung cancer.   Patient education in fact sheet provided along with most recent CT scans.  Plan: Reviewed most recent CT chest. No new issues.  Disposition: No follow-up in clinic.   Visit Diagnosis No diagnosis found.  Patient expressed understanding and was in agreement with this plan. She also understands that She can call clinic at any time with any questions, concerns, or complaints.   Greater than 50% was spent in counseling and coordination of care with this patient including but not limited to discussion of the relevant topics above (See A&P) including, but not limited to diagnosis and management of acute and chronic medical conditions.   Thank you for allowing me to participate in the care of this very pleasant patient.    Mauro KaufmannJennifer E Seng Fouts, NP [facility] Cell - [phone] Pager- [phone] 02/03/2021 3:13 PM  CC: Dr. Birdie SonsSonnenberg

## 2021-02-09 ENCOUNTER — Telehealth: Payer: Self-pay

## 2021-02-09 NOTE — Telephone Encounter (Signed)
Please get her set up for a virtual visit with any available provider some time today or tomorrow. Thanks.

## 2021-02-09 NOTE — Telephone Encounter (Signed)
Pt called and states that she went to a funeral on the 28th of May and was exposed to Covid. She started having symptoms two days later. She tested positive with a home test on 02/08/21. She is having chest congestion and running a low grade fever of 99.9 today. She would like to know what she can take for symptoms. Please advise at [phone]

## 2021-02-09 NOTE — Telephone Encounter (Signed)
I called and schedule the patient for in the morning.  Mattia Liford,cma

## 2021-02-10 ENCOUNTER — Telehealth: Payer: Medicare Other | Admitting: Family Medicine

## 2021-02-10 ENCOUNTER — Other Ambulatory Visit: Payer: Self-pay

## 2021-02-10 ENCOUNTER — Encounter: Payer: Self-pay | Admitting: Internal Medicine

## 2021-02-10 ENCOUNTER — Telehealth (INDEPENDENT_AMBULATORY_CARE_PROVIDER_SITE_OTHER): Payer: Medicare Other | Admitting: Internal Medicine

## 2021-02-10 VITALS — BP 150/76 | HR 80 | Temp 97.9°F | Ht 59.0 in | Wt 132.0 lb

## 2021-02-10 DIAGNOSIS — J441 Chronic obstructive pulmonary disease with (acute) exacerbation: Secondary | ICD-10-CM | POA: Diagnosis not present

## 2021-02-10 DIAGNOSIS — U071 COVID-19: Secondary | ICD-10-CM | POA: Insufficient documentation

## 2021-02-10 DIAGNOSIS — J40 Bronchitis, not specified as acute or chronic: Secondary | ICD-10-CM | POA: Diagnosis not present

## 2021-02-10 MED ORDER — ALBUTEROL SULFATE HFA 108 (90 BASE) MCG/ACT IN AERS: 1.0000 | INHALATION_SPRAY | Freq: Four times a day (QID) | RESPIRATORY_TRACT | 2 refills | Status: DC | PRN

## 2021-02-10 MED ORDER — PREDNISONE 20 MG PO TABS: 20.0000 mg | ORAL_TABLET | Freq: Every day | ORAL | 0 refills | Status: DC

## 2021-02-10 MED ORDER — AZITHROMYCIN 250 MG PO TABS
ORAL_TABLET | ORAL | 0 refills | Status: AC
Start: 2021-02-10 — End: 2021-02-15

## 2021-02-10 MED ORDER — BUDESONIDE-FORMOTEROL FUMARATE 160-4.5 MCG/ACT IN AERO: 2.0000 | INHALATION_SPRAY | Freq: Two times a day (BID) | RESPIRATORY_TRACT | 2 refills | Status: DC

## 2021-02-10 NOTE — Progress Notes (Signed)
Went to a party last week on the 27 th, and she was then contacted and told 3 people tested positive for COVID.  Symptoms started last Tuesday. Tested positive for COVID Saturday via home test.

## 2021-02-10 NOTE — Progress Notes (Signed)
Telephone Note  I connected with Gail Pierce  on 02/10/21 at  7:30 AM EDT by telephone and verified that I am speaking with the correct person using two identifiers.  Location patient: home, [place] Location provider:work or home office Persons participating in the virtual visit: patient, provider  I discussed the limitations of evaluation and management by telemedicine and the availability of in person appointments. The patient expressed understanding and agreed to proceed.   HPI:  Acute telemedicine visit for : Funeral 02/04/21 and covid 19 + exposure at funeral for neighbor. C/o sob, productive cough, hoarseness temp weds was 99.9 F. Reviewed CT + COPD she is former smoker. covid 19 + 02/06/21  02/02/21 CT scan  IMPRESSION: 1. No change in an irregular opacity of the dependent right lower lobe, measuring approximately 0.8 x 0.4 cm. This is benign and no further follow-up or characterization is required. 2. Additional small pulmonary nodules scattered throughout are unchanged and likewise definitively benign. No further follow-up or characterization is required. 3. Emphysema. 4. Diffuse bilateral bronchial wall thickening, consistent with nonspecific infectious or inflammatory bronchitis. 5. Cardiomegaly.  Aortic Atherosclerosis (ICD10-I70.0) and Emphysema (ICD10-J43.9).   Electronically Signed   By: Lauralyn Primes M.D.   On: 02/03/2021 12:17 -COVID-19 vaccine status: 3/3   ROS: See pertinent positives and negatives per HPI.  Past Medical History:  Diagnosis Date  . Allergy   . COVID-19    02/06/21  . Hx of diverticulitis of colon   . Hypertension     Past Surgical History:  Procedure Laterality Date  . BREAST BIOPSY Left 1980   benign  . SEPTOPLASTY    . TONSILLECTOMY       Current Outpatient Medications:  .  albuterol (VENTOLIN HFA) 108 (90 Base) MCG/ACT inhaler, Inhale 1-2 puffs into the lungs every 6 (six) hours as needed for wheezing or shortness of  breath., Disp: 18 g, Rfl: 2 .  alendronate (FOSAMAX) 70 MG tablet, Take 1 tablet (70 mg total) by mouth every 7 (seven) days. Take with a full glass of water on an empty stomach., Disp: 13 tablet, Rfl: 3 .  amLODipine (NORVASC) 5 MG tablet, Take 2 tablets (10 mg total) by mouth daily., Disp: 180 tablet, Rfl: 3 .  apixaban (ELIQUIS) 2.5 MG TABS tablet, Take 1 tablet (2.5 mg total) by mouth 2 (two) times daily., Disp: 60 tablet, Rfl: 5 .  Ascorbic Acid (VITAMIN C) 100 MG tablet, Take 100 mg by mouth daily., Disp: , Rfl:  .  azithromycin (ZITHROMAX) 250 MG tablet, Take 2 tablets on day 1, then 1 tablet daily on days 2 through 5 with food, Disp: 6 tablet, Rfl: 0 .  budesonide-formoterol (SYMBICORT) 160-4.5 MCG/ACT inhaler, Inhale 2 puffs into the lungs 2 (two) times daily. Rinse mouth after use, Disp: 10.2 each, Rfl: 2 .  Calcium Carbonate-Vitamin D (CALCIUM 500/D PO), Take by mouth., Disp: , Rfl:  .  Cholecalciferol (VITAMIN D3) 1000 units CAPS, Take by mouth., Disp: , Rfl:  .  ciclopirox (PENLAC) 8 % solution, ciclopirox 8 % topical solution  APPLY TOPICALLY NIGHTLY OVER NAIL AND SURROUNDING SKIN., Disp: , Rfl:  .  Corn Dextrin (EASY FIBER PO), Take by mouth., Disp: , Rfl:  .  Cyanocobalamin 2500 MCG CHEW, Chew by mouth., Disp: , Rfl:  .  DHA-EPA-Flaxseed Oil-Vitamin E (THERA TEARS NUTRITION PO), Take by mouth., Disp: , Rfl:  .  Glucosamine-Chondroit-Vit C-Mn (GLUCOSAMINE 1500 COMPLEX PO), Take by mouth., Disp: , Rfl:  .  Influenza Vac  High-Dose Quad (FLUZONE HIGH-DOSE QUADRIVALENT) 0.7 ML SUSY, Fluzone High-Dose Quad 2020-21 (PF) 240 mcg/0.7 mL IM syringe, Disp: , Rfl:  .  Influenza vac split quadrivalent PF (FLUZONE HIGH-DOSE) 0.5 ML injection, Fluzone High-Dose 2019-20 (PF) 180 mcg/0.5 mL intramuscular syringe  PHARMACIST ADMINISTERED IMMUNIZATION ADMINISTERED AT TIME OF DISPENSING, Disp: , Rfl:  .  Multiple Vitamins-Minerals (CENTRUM ADULTS PO), Take by mouth., Disp: , Rfl:  .  predniSONE  (DELTASONE) 20 MG tablet, Take 1 tablet (20 mg total) by mouth daily with breakfast. X 5-10 days, Disp: 10 tablet, Rfl: 0 .  rosuvastatin (CRESTOR) 10 MG tablet, Take 1 tablet (10 mg total) by mouth daily., Disp: 90 tablet, Rfl: 3 .  vitamin A 8000 UNIT capsule, Take 8,000 Units by mouth daily., Disp: , Rfl:   EXAM:  VITALS per patient if applicable:  GENERAL: alert, oriented, appears well and in no acute distress  HEENT:+hoarse voice  LUNGS: on inspection no signs of respiratory distress, breathing rate appears normal, no obvious gross SOB, gasping or wheezing No cough on exam   ASSESSMENT AND PLAN:  Discussed the following assessment and plan:  COPD exacerbation (HCC) likely due to covid 19 Reviewed CT chest 01/27/21 see HPI  COVID-19 - Plan: albuterol (VENTOLIN HFA) 108 (90 Base) MCG/ACT inhaler, azithromycin (ZITHROMAX) 250 MG tablet, predniSONE (DELTASONE) 20 MG tablet qd 5-10 days, budesonide-formoterol (SYMBICORT) 160-4.5 MCG/ACT inhaler  Bronchitis due to COVID-19 virus - Plan: albuterol (VENTOLIN HFA) 108 (90 Base) MCG/ACT inhaler, azithromycin (ZITHROMAX) 250 MG tablet, predniSONE (DELTASONE) 20 MG tablet, budesonide-formoterol (SYMBICORT) 160-4.5 MCG/ACT inhaler  -we discussed possible serious and likely etiologies, options for evaluation and workup, limitations of telemedicine visit vs in person visit, treatment, treatment risks and precautions. Pt prefers to treat via telemedicine empirically rather than in person at this moment.   I discussed the assessment and treatment plan with the patient. The patient was provided an opportunity to ask questions and all were answered. The patient agreed with the plan and demonstrated an understanding of the instructions.    Time spen 20 min Bevelyn Buckles, MD

## 2021-02-10 NOTE — Patient Instructions (Signed)
If needing prescription strength medication we will need to make an appointment with a provider.  These are over the counter medication options:  Mucinex dm green label for cough or robitussin DM  Multivitamin for women cepacol or chloroseptic spray for sore throat  Sugar free cough drops Warm tea with honey and lemon  Hydration  Try to eat though you dont feel like it   Tylenol or Advil  Nasal saline  Flonase     Monitor pulse oximeter, buy from [facility] if oxygen is less than 90 please go to the hospital.   Are you feeling really sick? Shortness of breath, cough, chest pain?, dizziness? Confusion   If so let me know  If worsening, go to hospital or [facility] clinic Urgent care for further treatment.

## 2021-03-23 ENCOUNTER — Other Ambulatory Visit: Payer: Self-pay | Admitting: Family Medicine

## 2021-03-23 DIAGNOSIS — I480 Paroxysmal atrial fibrillation: Secondary | ICD-10-CM

## 2021-03-30 ENCOUNTER — Ambulatory Visit: Payer: Medicare Other

## 2021-03-31 ENCOUNTER — Ambulatory Visit: Payer: Medicare Other

## 2021-03-31 ENCOUNTER — Telehealth: Payer: Self-pay

## 2021-03-31 NOTE — Telephone Encounter (Signed)
Called patient on preferred number provided for awv visit according to appointment note. No answer. No voicemail. Reschedule as appropriate.

## 2021-04-08 ENCOUNTER — Telehealth: Payer: Self-pay | Admitting: Family Medicine

## 2021-04-08 NOTE — Telephone Encounter (Signed)
Left message for patient's daughter,Celinda, to call back and schedule Medicare Annual Wellness Visit (AWV) in office.   If not able to come in office, please offer to do virtually or by telephone.   Last AWV:03/29/2020  Please schedule at anytime with Nurse Health Advisor.

## 2021-04-25 ENCOUNTER — Other Ambulatory Visit: Payer: Self-pay

## 2021-04-25 ENCOUNTER — Encounter: Payer: Self-pay | Admitting: Cardiology

## 2021-04-25 ENCOUNTER — Ambulatory Visit: Payer: Medicare Other | Admitting: Cardiology

## 2021-04-25 VITALS — BP 160/74 | HR 69 | Ht 59.0 in | Wt 134.0 lb

## 2021-04-25 DIAGNOSIS — I10 Essential (primary) hypertension: Secondary | ICD-10-CM | POA: Diagnosis not present

## 2021-04-25 DIAGNOSIS — I4819 Other persistent atrial fibrillation: Secondary | ICD-10-CM | POA: Diagnosis not present

## 2021-04-25 DIAGNOSIS — E7800 Pure hypercholesterolemia, unspecified: Secondary | ICD-10-CM | POA: Diagnosis not present

## 2021-04-25 MED ORDER — AMLODIPINE BESYLATE 5 MG PO TABS: 5.0000 mg | ORAL_TABLET | Freq: Every day | ORAL | 3 refills | Status: DC

## 2021-04-25 NOTE — Patient Instructions (Signed)
Medication Instructions:  Your physician recommends that you continue on your current medications as directed. Please refer to the Current Medication list given to you today.  *If you need a refill on your cardiac medications before your next appointment, please call your pharmacy*   Lab Work: None ordered If you have labs (blood work) drawn today and your tests are completely normal, you will receive your results only by: MyChart Message (if you have MyChart) OR A paper copy in the mail If you have any lab test that is abnormal or we need to change your treatment, we will call you to review the results.   Testing/Procedures: None ordered   Follow-Up: At [facility], you and your health needs are our priority.  As part of our continuing mission to provide you with exceptional heart care, we have created designated Provider Care Teams.  These Care Teams include your primary Cardiologist (physician) and Advanced Practice Providers (APPs -  Physician Assistants and Nurse Practitioners) who all work together to provide you with the care you need, when you need it.  We recommend signing up for the patient portal called "MyChart".  Sign up information is provided on this After Visit Summary.  MyChart is used to connect with patients for Virtual Visits (Telemedicine).  Patients are able to view lab/test results, encounter notes, upcoming appointments, etc.  Non-urgent messages can be sent to your provider as well.   To learn more about what you can do with MyChart, go to [email or web address].    Your next appointment:   6 month(s)  The format for your next appointment:   In Person  Provider:   You may see Dr. Agbor-Etang or one of the following Advanced Practice Providers on your designated Care Team:   Christopher Berge, NP Ryan Dunn, PA-C Jacquelyn Visser, PA-C Cadence Furth, PA-C   Other Instructions   

## 2021-04-25 NOTE — Progress Notes (Signed)
Cardiology Office Note:    Date:  04/25/2021   ID:  Gail Pierce, DOB 05/19/1931, MRN [id number]  PCP:  Glori Luis, MD  [facility] Cardiologist:  None  [facility] Electrophysiologist:  None   Referring MD: Glori Luis, MD   Chief Complaint  Patient presents with   Other    6 month follow up. Meds reviewed verbally with patient.    History of Present Illness:    Gail Pierce is a 87 y.o. female with a hx of permanent A. fib, hypertension, stroke who presents for follow-up.  Patient being seen due to permanent A. fib.  Previous echo was normal.  Has been going through some stress in her community due to her close friend passing.  Couple of months ago, she was diagnosed with COVID.  Did not have any symptoms.  Quarantine for 14 days and also received COVID-19 treatment.  Currently feels well, denies chest pain, palpitations, shortness of breath.  Compliant with her medications including Eliquis 2.5 mg twice daily.  States her blood pressure goes up and down when she checks.  Takes Eliquis 1 tab/5 mg daily.  Has no concerns at this time, lives by herself in a nice community.  Prior notes,  echo 08/2020 normal systolic function, EF 60 to 65% Stroke in August 2021.  Head CT October 2021 showed old infarct.  Past Medical History:  Diagnosis Date   Allergy    COVID-19    02/06/21   Hx of diverticulitis of colon    Hypertension     Past Surgical History:  Procedure Laterality Date   BREAST BIOPSY Left 1980   benign   SEPTOPLASTY     TONSILLECTOMY      Current Medications: Current Meds  Medication Sig   albuterol (VENTOLIN HFA) 108 (90 Base) MCG/ACT inhaler Inhale 1-2 puffs into the lungs every 6 (six) hours as needed for wheezing or shortness of breath.   alendronate (FOSAMAX) 70 MG tablet Take 1 tablet (70 mg total) by mouth every 7 (seven) days. Take with a full glass of water on an empty stomach.   Ascorbic Acid (VITAMIN C) 100 MG tablet  Take 100 mg by mouth daily.   budesonide-formoterol (SYMBICORT) 160-4.5 MCG/ACT inhaler Inhale 2 puffs into the lungs 2 (two) times daily. Rinse mouth after use   Calcium Carbonate-Vitamin D (CALCIUM 500/D PO) Take by mouth.   Cholecalciferol (VITAMIN D3) 1000 units CAPS Take by mouth.   ciclopirox (PENLAC) 8 % solution ciclopirox 8 % topical solution  APPLY TOPICALLY NIGHTLY OVER NAIL AND SURROUNDING SKIN.   Corn Dextrin (EASY FIBER PO) Take by mouth.   Cyanocobalamin 2500 MCG CHEW Chew by mouth.   DHA-EPA-Flaxseed Oil-Vitamin E (THERA TEARS NUTRITION PO) Take by mouth.   ELIQUIS 2.5 MG TABS tablet Take 1 tablet by mouth twice daily   Glucosamine-Chondroit-Vit C-Mn (GLUCOSAMINE 1500 COMPLEX PO) Take by mouth.   Influenza Vac High-Dose Quad (FLUZONE HIGH-DOSE QUADRIVALENT) 0.7 ML SUSY Fluzone High-Dose Quad 2020-21 (PF) 240 mcg/0.7 mL IM syringe   Influenza vac split quadrivalent PF (FLUZONE HIGH-DOSE) 0.5 ML injection Fluzone High-Dose 2019-20 (PF) 180 mcg/0.5 mL intramuscular syringe  PHARMACIST ADMINISTERED IMMUNIZATION ADMINISTERED AT TIME OF DISPENSING   Multiple Vitamins-Minerals (CENTRUM ADULTS PO) Take by mouth.   rosuvastatin (CRESTOR) 10 MG tablet Take 1 tablet (10 mg total) by mouth daily.   vitamin A 8000 UNIT capsule Take 8,000 Units by mouth daily.   [DISCONTINUED] amLODipine (NORVASC) 5 MG tablet Take 2 tablets (  10 mg total) by mouth daily.     Allergies:   Patient has no known allergies.   Social History   Socioeconomic History   Marital status: Widowed    Spouse name: Not on file   Number of children: Not on file   Years of education: Not on file   Highest education level: Not on file  Occupational History   Not on file  Tobacco Use   Smoking status: Former   Smokeless tobacco: Never  Vaping Use   Vaping Use: Never used  Substance and Sexual Activity   Alcohol use: Yes    Comment: OCC   Drug use: No   Sexual activity: Not on file  Other Topics Concern   Not  on file  Social History Narrative   Not on file   Social Determinants of Health   Financial Resource Strain: Not on file  Food Insecurity: Not on file  Transportation Needs: Not on file  Physical Activity: Not on file  Stress: Not on file  Social Connections: Not on file     Family History: The patient's family history includes Heart attack in her father; Leukemia in her mother.  ROS:   Please see the history of present illness.     All other systems reviewed and are negative.  EKGs/Labs/Other Studies Reviewed:    The following studies were reviewed today:   EKG:  EKG is  ordered today.  The ekg ordered today demonstrates A. fib ablation, heart rate 69  Recent Labs: 09/06/2020: ALT 20; ALT 20; BUN 11; Creatinine, Ser 0.71; Potassium 4.2; Sodium 139  Recent Lipid Panel    Component Value Date/Time   CHOL 163 07/21/2020 0956   TRIG 93.0 07/21/2020 0956   HDL 66.90 07/21/2020 0956   CHOLHDL 2 07/21/2020 0956   VLDL 18.6 07/21/2020 0956   LDLCALC 78 07/21/2020 0956   LDLDIRECT 40.0 08/09/2020 0957     Risk Assessment/Calculations:      Physical Exam:    VS:  BP (!) 160/74 (BP Location: Left Arm, Patient Position: Sitting, Cuff Size: Normal)   Pulse 69   Ht 4\' 11"  (1.499 m)   Wt 134 lb (60.8 kg)   SpO2 95%   BMI 27.06 kg/m     Wt Readings from Last 3 Encounters:  04/25/21 134 lb (60.8 kg)  02/10/21 132 lb (59.9 kg)  11/30/20 129 lb 9.6 oz (58.8 kg)     GEN:  Well nourished, well developed in no acute distress HEENT: Normal NECK: No JVD; No carotid bruits LYMPHATICS: No lymphadenopathy CARDIAC: Irregular irregular, nontachycardic RESPIRATORY:  Clear to auscultation without rales, wheezing or rhonchi  ABDOMEN: Soft, non-tender, non-distended MUSCULOSKELETAL:  No edema; No deformity  SKIN: Warm and dry NEUROLOGIC:  Alert and oriented x 3 PSYCHIATRIC:  Normal affect   ASSESSMENT:    1. Persistent atrial fibrillation (HCC)   2. Primary hypertension    3. Pure hypercholesterolemia     PLAN:    In order of problems listed above:  Permanent atrial fibrillation, CHA2DS2-VASc 5 (htn, age, gender, stroke).  Continue Eliquis 2.5 mg twice daily.  Currently asymptomatic.   Hypertension, BP elevated today, okay/reasonable for patient's age, Continue amlodipine.   Hyperlipidemia, Crestor.  Follow-up in 6 months   Medication Adjustments/Labs and Tests Ordered: Current medicines are reviewed at length with the patient today.  Concerns regarding medicines are outlined above.  Orders Placed This Encounter  Procedures   EKG 12-Lead    Meds ordered this encounter  Medications   amLODipine (NORVASC) 5 MG tablet    Sig: Take 1 tablet (5 mg total) by mouth daily.    Dispense:  180 tablet    Refill:  3    NO NEED TO FILL, dosage change     Patient Instructions  Medication Instructions:  Your physician recommends that you continue on your current medications as directed. Please refer to the Current Medication list given to you today.  *If you need a refill on your cardiac medications before your next appointment, please call your pharmacy*   Lab Work: None ordered If you have labs (blood work) drawn today and your tests are completely normal, you will receive your results only by: MyChart Message (if you have MyChart) OR A paper copy in the mail If you have any lab test that is abnormal or we need to change your treatment, we will call you to review the results.   Testing/Procedures: None ordered   Follow-Up: At [facility], you and your health needs are our priority.  As part of our continuing mission to provide you with exceptional heart care, we have created designated Provider Care Teams.  These Care Teams include your primary Cardiologist (physician) and Advanced Practice Providers (APPs -  Physician Assistants and Nurse Practitioners) who all work together to provide you with the care you need, when you need it.  We  recommend signing up for the patient portal called "MyChart".  Sign up information is provided on this After Visit Summary.  MyChart is used to connect with patients for Virtual Visits (Telemedicine).  Patients are able to view lab/test results, encounter notes, upcoming appointments, etc.  Non-urgent messages can be sent to your provider as well.   To learn more about what you can do with MyChart, go to [email or web address].    Your next appointment:   6 month(s)  The format for your next appointment:   In Person  Provider:   You may see Dr. Azucena Cecil  or one of the following Advanced Practice Providers on your designated Care Team:   Nicolasa Ducking, NP Eula Listen, PA-C Marisue Ivan, PA-C Cadence Bath, [place]   Other Instructions    Signed, Debbe Odea, MD  04/25/2021 12:08 PM    [facility]

## 2021-05-11 ENCOUNTER — Telehealth: Payer: Self-pay | Admitting: Family Medicine

## 2021-05-11 NOTE — Telephone Encounter (Signed)
Patient informed, Due to the high volume of calls and your symptoms we have to forward your call to our Triage Nurse to expedient your call. Please hold for the transfer.  Patient transferred to Access Nurse. Due to having blood in her stool last night.She has had diverticulitis in the past before and is unsure what to do next.No openings in office or virtual.

## 2021-05-11 NOTE — Telephone Encounter (Signed)
Patient calling back in. States she did not have a lot of blood in the stool. States she would see this when wiping.  No chest pain, SOB, or lightheadedness. Patient states that she used diaper rash cream to help soothe her bottom.   Patient scheduled to see [facility] provider

## 2021-05-11 NOTE — Telephone Encounter (Signed)
Noted.  Please try to follow-up with the patient regarding this.  Please see how much blood she has been having.  Has she had any chest pain, shortness of breath, or lightheadedness?  Certainly she could be seen at urgent care or the walk-in clinic to be evaluated and have vitals and possibly lab work done to help determine how quickly she should see GI.

## 2021-05-11 NOTE — Telephone Encounter (Signed)
Called Patients home phone/Daughters work phone and left a message. Called Gelene's cell phone and left a message.   Pt called into access nurse stating that she is having blood in her stools. Pt has had diverticulitis before and was unsure of what to do. Pt stated that she has seen blood on her tissue while wiping and she feels a "little  ball" at her rectum with a burning sensation.

## 2021-05-11 NOTE — Telephone Encounter (Signed)
Noted  

## 2021-05-11 NOTE — Telephone Encounter (Signed)
Patient returned office phone call. Please call home number [phone].

## 2021-05-11 NOTE — Telephone Encounter (Signed)
Kelly-an RN with access nurse- advised pt to be seen in the ED or UC in the next hour due to rectal bleeding. Pt is on a blood thinner. Pt refused. FYI

## 2021-05-12 ENCOUNTER — Other Ambulatory Visit: Payer: Self-pay

## 2021-05-12 ENCOUNTER — Encounter: Payer: Self-pay | Admitting: Nurse Practitioner

## 2021-05-12 ENCOUNTER — Ambulatory Visit (INDEPENDENT_AMBULATORY_CARE_PROVIDER_SITE_OTHER): Payer: Medicare Other | Admitting: Nurse Practitioner

## 2021-05-12 VITALS — BP 172/94 | HR 78 | Temp 98.5°F | Resp 16 | Ht 59.0 in | Wt 133.0 lb

## 2021-05-12 DIAGNOSIS — K625 Hemorrhage of anus and rectum: Secondary | ICD-10-CM | POA: Diagnosis not present

## 2021-05-12 LAB — HEMOCCULT GUIAC POC 1CARD (OFFICE): Fecal Occult Blood, POC: NEGATIVE

## 2021-05-12 LAB — CBC
HCT: 39.4 % (ref 36.0–46.0)
Hemoglobin: 13.2 g/dL (ref 12.0–15.0)
MCHC: 33.6 g/dL (ref 30.0–36.0)
MCV: 89.9 fl (ref 78.0–100.0)
Platelets: 213 10*3/uL (ref 150.0–400.0)
RBC: 4.38 Mil/uL (ref 3.87–5.11)
RDW: 14.1 % (ref 11.5–15.5)
WBC: 6.4 10*3/uL (ref 4.0–10.5)

## 2021-05-12 NOTE — Progress Notes (Signed)
Acute Office Visit  Subjective:    Patient ID: Gail Pierce, female    DOB: Jun 08, 1931, 87 y.o.   MRN: [id number]  Chief Complaint  Patient presents with   Rectal Problems    Has noticed blood on the toilet paper the past 2 days. No blood in the stool. No black stools. Some rectal irritation but no pain. Normal bowel movement texture.    HPI Patient is in today for blood in her stool  2 days ago noticed one episode of BRPR. Has had BMS without BRPR. No history of colon cancers. She did mention that she has been dx with diverticulosis. She is also on a low dose of eliquis. No dizziness or lightheadedness.  States she noticed it on the toilet tissue, none in the stool or toilet water.  Past Medical History:  Diagnosis Date   Allergy    COVID-19    02/06/21   Hx of diverticulitis of colon    Hypertension     Past Surgical History:  Procedure Laterality Date   BREAST BIOPSY Left 1980   benign   SEPTOPLASTY     TONSILLECTOMY      Family History  Problem Relation Age of Onset   Leukemia Mother    Heart attack Father     Social History   Socioeconomic History   Marital status: Widowed    Spouse name: Not on file   Number of children: Not on file   Years of education: Not on file   Highest education level: Not on file  Occupational History   Not on file  Tobacco Use   Smoking status: Former   Smokeless tobacco: Never  Vaping Use   Vaping Use: Never used  Substance and Sexual Activity   Alcohol use: Yes    Comment: OCC   Drug use: No   Sexual activity: Not on file  Other Topics Concern   Not on file  Social History Narrative   Not on file   Social Determinants of Health   Financial Resource Strain: Not on file  Food Insecurity: Not on file  Transportation Needs: Not on file  Physical Activity: Not on file  Stress: Not on file  Social Connections: Not on file  Intimate Partner Violence: Not on file    Outpatient Medications Prior to Visit   Medication Sig Dispense Refill   albuterol (VENTOLIN HFA) 108 (90 Base) MCG/ACT inhaler Inhale 1-2 puffs into the lungs every 6 (six) hours as needed for wheezing or shortness of breath. 18 g 2   alendronate (FOSAMAX) 70 MG tablet Take 1 tablet (70 mg total) by mouth every 7 (seven) days. Take with a full glass of water on an empty stomach. 13 tablet 3   amLODipine (NORVASC) 5 MG tablet Take 1 tablet (5 mg total) by mouth daily. 180 tablet 3   Ascorbic Acid (VITAMIN C) 100 MG tablet Take 100 mg by mouth daily.     budesonide-formoterol (SYMBICORT) 160-4.5 MCG/ACT inhaler Inhale 2 puffs into the lungs 2 (two) times daily. Rinse mouth after use 10.2 each 2   Calcium Carbonate-Vitamin D (CALCIUM 500/D PO) Take by mouth.     Cholecalciferol (VITAMIN D3) 1000 units CAPS Take by mouth.     ciclopirox (PENLAC) 8 % solution ciclopirox 8 % topical solution  APPLY TOPICALLY NIGHTLY OVER NAIL AND SURROUNDING SKIN.     Corn Dextrin (EASY FIBER PO) Take by mouth.     Cyanocobalamin 2500 MCG CHEW Chew by mouth.  DHA-EPA-Flaxseed Oil-Vitamin E (THERA TEARS NUTRITION PO) Take by mouth.     ELIQUIS 2.5 MG TABS tablet Take 1 tablet by mouth twice daily 180 tablet 0   Glucosamine-Chondroit-Vit C-Mn (GLUCOSAMINE 1500 COMPLEX PO) Take by mouth.     Multiple Vitamins-Minerals (CENTRUM ADULTS PO) Take by mouth.     rosuvastatin (CRESTOR) 10 MG tablet Take 1 tablet (10 mg total) by mouth daily. 90 tablet 3   vitamin A 8000 UNIT capsule Take 8,000 Units by mouth daily.     Influenza Vac High-Dose Quad (FLUZONE HIGH-DOSE QUADRIVALENT) 0.7 ML SUSY Fluzone High-Dose Quad 2020-21 (PF) 240 mcg/0.7 mL IM syringe     Influenza vac split quadrivalent PF (FLUZONE HIGH-DOSE) 0.5 ML injection Fluzone High-Dose 2019-20 (PF) 180 mcg/0.5 mL intramuscular syringe  PHARMACIST ADMINISTERED IMMUNIZATION ADMINISTERED AT TIME OF DISPENSING     No facility-administered medications prior to visit.    No Known Allergies  Review of  Systems  Constitutional:  Negative for chills and fever.  Respiratory:  Negative for shortness of breath.   Cardiovascular:  Negative for chest pain.  Gastrointestinal:  Positive for anal bleeding. Negative for abdominal distention, abdominal pain, constipation, diarrhea, nausea, rectal pain and vomiting.  Skin:  Negative for color change and pallor.  Neurological:  Negative for dizziness, weakness and light-headedness.      Objective:    Physical Exam Vitals and nursing note reviewed. Exam conducted with a chaperone present [facility], CMA).  Constitutional:      Appearance: Normal appearance.  Cardiovascular:     Rate and Rhythm: Normal rate.  Pulmonary:     Effort: Pulmonary effort is normal.     Breath sounds: Normal breath sounds.  Abdominal:     General: Bowel sounds are normal. There is no distension.     Palpations: There is no mass.     Tenderness: There is no abdominal tenderness.     Hernia: No hernia is present.  Genitourinary:    Rectum: Guaiac result negative.     Comments: Rectum normal. No hemorrhoids or fissure visualized. No internal mass or hemorrhoids palpated. Good rectal tone. Soft stool in rectal vault  Neurological:     Mental Status: She is alert.    BP (!) 172/94   Pulse 78   Temp 98.5 F (36.9 C)   Resp 16   Ht 4\' 11"  (1.499 m)   Wt 133 lb (60.3 kg)   SpO2 97%   BMI 26.86 kg/m  Wt Readings from Last 3 Encounters:  05/12/21 133 lb (60.3 kg)  04/25/21 134 lb (60.8 kg)  02/10/21 132 lb (59.9 kg)    Health Maintenance Due  Topic Date Due   Zoster Vaccines- Shingrix (1 of 2) Never done   PNA vac Low Risk Adult (2 of 2 - PCV13) 01/01/2015   COVID-19 Vaccine (4 - Booster for Pfizer series) 08/10/2020   INFLUENZA VACCINE  04/11/2021    There are no preventive care reminders to display for this patient.   Lab Results  Component Value Date   TSH 3.50 08/05/2018   Lab Results  Component Value Date   WBC 5.9 08/05/2018   HGB  13.2 08/05/2018   HCT 39.1 08/05/2018   MCV 90.3 08/05/2018   PLT 226.0 08/05/2018   Lab Results  Component Value Date   NA 139 09/06/2020   K 4.2 09/06/2020   CO2 27 09/06/2020   GLUCOSE 106 (H) 09/06/2020   BUN 11 09/06/2020   CREATININE 0.71 09/06/2020  BILITOT 0.6 09/06/2020   BILITOT 0.6 09/06/2020   ALKPHOS 55 09/06/2020   ALKPHOS 55 09/06/2020   AST 24 09/06/2020   AST 24 09/06/2020   ALT 20 09/06/2020   ALT 20 09/06/2020   PROT 7.1 09/06/2020   PROT 7.1 09/06/2020   ALBUMIN 4.4 09/06/2020   ALBUMIN 4.4 09/06/2020   CALCIUM 10.4 09/06/2020   GFR 76.64 09/06/2020   Lab Results  Component Value Date   CHOL 163 07/21/2020   Lab Results  Component Value Date   HDL 66.90 07/21/2020   Lab Results  Component Value Date   LDLCALC 78 07/21/2020   Lab Results  Component Value Date   TRIG 93.0 07/21/2020   Lab Results  Component Value Date   CHOLHDL 2 07/21/2020   Lab Results  Component Value Date   HGBA1C 5.8 07/21/2020       Assessment & Plan:   Problem List Items Addressed This Visit       Digestive   Bright red blood per rectum - Primary    Patient states happened 2 days ago.  Noticed bright red blood on toilet tissue when she was wiping.  States she did not notice any blood in the water nor on her stool.  Felt like she felt a lump when she wiped.  Had approximately 5 bowel movements with only 1 episode thus far.  Patient seems to eat a very healthy balanced diet.  She is on Eliquis we will check CBC in office to make sure hemoglobin has not dropped.  Hemoccult card in office was negative.  Patient last colonoscopy was in the past 5 years.  Did inform patient if it happens again we can always refer her to GI.  Pending lab results      Relevant Orders   CBC   Hemoccult - 1 Card (office)     No orders of the defined types were placed in this encounter.  This visit occurred during the SARS-CoV-2 public health emergency.  Safety protocols were in  place, including screening questions prior to the visit, additional usage of staff PPE, and extensive cleaning of exam room while observing appropriate contact time as indicated for disinfecting solutions.   Audria Nine, NP

## 2021-05-12 NOTE — Patient Instructions (Signed)
Exam was reassuring. Will be in touch with lab results. If it happens again we can refer you to GI

## 2021-05-12 NOTE — Telephone Encounter (Signed)
Called the patient to confirm her appointment and give directions to [facility]. Pt states that she is having her Granddaughter drive her so she can make it to the [place] office for an in person visit. Gail Pierce had no further questions.

## 2021-05-12 NOTE — Assessment & Plan Note (Signed)
Patient states happened 2 days ago.  Noticed bright red blood on toilet tissue when she was wiping.  States she did not notice any blood in the water nor on her stool.  Felt like she felt a lump when she wiped.  Had approximately 5 bowel movements with only 1 episode thus far.  Patient seems to eat a very healthy balanced diet.  She is on Eliquis we will check CBC in office to make sure hemoglobin has not dropped.  Hemoccult card in office was negative.  Patient last colonoscopy was in the past 5 years.  Did inform patient if it happens again we can always refer her to GI.  Pending lab results

## 2021-06-03 ENCOUNTER — Ambulatory Visit (INDEPENDENT_AMBULATORY_CARE_PROVIDER_SITE_OTHER): Payer: Medicare Other | Admitting: Family Medicine

## 2021-06-03 ENCOUNTER — Other Ambulatory Visit: Payer: Self-pay

## 2021-06-03 ENCOUNTER — Encounter: Payer: Self-pay | Admitting: Family Medicine

## 2021-06-03 VITALS — BP 140/80 | HR 72 | Temp 97.9°F | Ht 59.0 in | Wt 135.0 lb

## 2021-06-03 DIAGNOSIS — G8929 Other chronic pain: Secondary | ICD-10-CM

## 2021-06-03 DIAGNOSIS — I10 Essential (primary) hypertension: Secondary | ICD-10-CM | POA: Diagnosis not present

## 2021-06-03 DIAGNOSIS — I4821 Permanent atrial fibrillation: Secondary | ICD-10-CM

## 2021-06-03 DIAGNOSIS — B351 Tinea unguium: Secondary | ICD-10-CM

## 2021-06-03 DIAGNOSIS — M25511 Pain in right shoulder: Secondary | ICD-10-CM

## 2021-06-03 DIAGNOSIS — Z23 Encounter for immunization: Secondary | ICD-10-CM

## 2021-06-03 MED ORDER — AMLODIPINE BESYLATE 5 MG PO TABS
5.0000 mg | ORAL_TABLET | Freq: Two times a day (BID) | ORAL | 3 refills | Status: DC
Start: 2021-06-03 — End: 2021-11-24

## 2021-06-03 MED ORDER — CICLOPIROX 8 % EX SOLN: CUTANEOUS | 1 refills | Status: AC

## 2021-06-03 MED ORDER — PREDNISONE 20 MG PO TABS
40.0000 mg | ORAL_TABLET | Freq: Every day | ORAL | 0 refills | Status: AC
Start: 2021-06-03 — End: ?

## 2021-06-03 NOTE — Assessment & Plan Note (Addendum)
Undetermined cause.  We will trial short course of prednisone to see if that helps with inflammation.  Advised to monitor for sleeping difficulty and agitation.  At this point she needs to see orthopedics.  Referral placed.

## 2021-06-03 NOTE — Assessment & Plan Note (Signed)
Rate controlled.  She will continue Eliquis 2.5 mg twice daily. 

## 2021-06-03 NOTE — Patient Instructions (Signed)
Nice to see you. Please continue to check your blood pressure at home. Somebody from orthopedic should call you. Please take the prednisone to see if that helps with your shoulder pain. I did refill the ciclopirox.

## 2021-06-03 NOTE — Progress Notes (Signed)
Marikay Alar, MD Phone: [phone]  Gail Pierce is a 87 y.o. female who presents today for follow-up.  Right shoulder pain: Patient notes this has been going on over the last year.  She had an injury about a year ago.  She had multiple x-rays.  The x-ray from November 2021 did not reveal any fractures.  She gets pain shooting down from her shoulder.  No neck pain.  She uses Voltaren with no benefit.  No numbness or weakness.  She did PT with little benefit.  Hypertension: Typically 120s-140s/62-70.  She wonders if her blood pressure cuff fits correctly.  She is on amlodipine 5 mg twice daily.  No chest pain or shortness of breath.  Minimal edema at times in her legs.  She tries to minimize salt.  A. fib: Taking Eliquis.  No palpitations.  No bleeding issues.  Onychomycosis: Taking ciclopirox.  She has been doing this for about 6 months per her report.  Notes it has helped some.  Social History   Tobacco Use  Smoking Status Former  Smokeless Tobacco Never    Current Outpatient Medications on File Prior to Visit  Medication Sig Dispense Refill   albuterol (VENTOLIN HFA) 108 (90 Base) MCG/ACT inhaler Inhale 1-2 puffs into the lungs every 6 (six) hours as needed for wheezing or shortness of breath. 18 g 2   alendronate (FOSAMAX) 70 MG tablet Take 1 tablet (70 mg total) by mouth every 7 (seven) days. Take with a full glass of water on an empty stomach. 13 tablet 3   Ascorbic Acid (VITAMIN C) 100 MG tablet Take 100 mg by mouth daily.     budesonide-formoterol (SYMBICORT) 160-4.5 MCG/ACT inhaler Inhale 2 puffs into the lungs 2 (two) times daily. Rinse mouth after use 10.2 each 2   Calcium Carbonate-Vitamin D (CALCIUM 500/D PO) Take by mouth.     Cholecalciferol (VITAMIN D3) 1000 units CAPS Take by mouth.     Corn Dextrin (EASY FIBER PO) Take by mouth.     Cyanocobalamin 2500 MCG CHEW Chew by mouth.     DHA-EPA-Flaxseed Oil-Vitamin E (THERA TEARS NUTRITION PO) Take by mouth.      ELIQUIS 2.5 MG TABS tablet Take 1 tablet by mouth twice daily 180 tablet 0   Glucosamine-Chondroit-Vit C-Mn (GLUCOSAMINE 1500 COMPLEX PO) Take by mouth.     Multiple Vitamins-Minerals (CENTRUM ADULTS PO) Take by mouth.     rosuvastatin (CRESTOR) 10 MG tablet Take 1 tablet (10 mg total) by mouth daily. 90 tablet 3   vitamin A 8000 UNIT capsule Take 8,000 Units by mouth daily.     No current facility-administered medications on file prior to visit.     ROS see history of present illness  Objective  Physical Exam Vitals:   06/03/21 0832 06/03/21 0921  BP: 140/90 140/80  Pulse: 72   Temp: 97.9 F (36.6 C)   SpO2: 98%     BP Readings from Last 3 Encounters:  06/03/21 140/80  05/12/21 (!) 172/94  04/25/21 (!) 160/74   Wt Readings from Last 3 Encounters:  06/03/21 135 lb (61.2 kg)  05/12/21 133 lb (60.3 kg)  04/25/21 134 lb (60.8 kg)    Physical Exam Constitutional:      General: She is not in acute distress.    Appearance: She is not diaphoretic.  Cardiovascular:     Rate and Rhythm: Normal rate. Rhythm irregularly irregular.     Heart sounds: Normal heart sounds.  Pulmonary:     Effort:  Pulmonary effort is normal.     Breath sounds: Normal breath sounds.  Musculoskeletal:     Comments: Right shoulder with no tenderness, she has slightly limited range of motion related to discomfort on passive range of motion with discomfort in external rotation and internal rotation as well as abduction  Skin:    General: Skin is warm and dry.  Neurological:     Mental Status: She is alert.     Assessment/Plan: Please see individual problem list.  Problem List Items Addressed This Visit     Atrial fibrillation (HCC)    Rate controlled.  She will continue Eliquis 2.5 mg twice daily.      Relevant Medications   amLODipine (NORVASC) 5 MG tablet   Hypertension    Adequate control at home.  She will continue amlodipine 5 mg twice daily.      Relevant Medications   amLODipine  (NORVASC) 5 MG tablet   Onychomycosis    She can continue with ciclopirox.      Relevant Medications   ciclopirox (PENLAC) 8 % solution   Right shoulder pain    Undetermined cause.  We will trial short course of prednisone to see if that helps with inflammation.  Advised to monitor for sleeping difficulty and agitation.  At this point she needs to see orthopedics.  Referral placed.      Relevant Medications   predniSONE (DELTASONE) 20 MG tablet   Other Relevant Orders   Ambulatory referral to Orthopedic Surgery   Other Visit Diagnoses     Need for immunization against influenza    -  Primary   Relevant Orders   Flu Vaccine QUAD High Dose(Fluad) (Completed)       Return in about 3 months (around 09/02/2021) for HTN with labs.  This visit occurred during the SARS-CoV-2 public health emergency.  Safety protocols were in place, including screening questions prior to the visit, additional usage of staff PPE, and extensive cleaning of exam room while observing appropriate contact time as indicated for disinfecting solutions.    Marikay Alar, MD [facility]

## 2021-06-03 NOTE — Assessment & Plan Note (Signed)
She can continue with ciclopirox.

## 2021-06-03 NOTE — Assessment & Plan Note (Signed)
Adequate control at home.  She will continue amlodipine 5 mg twice daily.

## 2021-06-07 ENCOUNTER — Telehealth: Payer: Self-pay | Admitting: Family Medicine

## 2021-06-07 NOTE — Telephone Encounter (Signed)
Rejection Reason - Patient Declined - Patient cancelled appointment, stated she was feeling better. Thank you." Gail Pierce said on Jun 06, 2021 2:59 PM  Msg from [facility]

## 2021-06-07 NOTE — Telephone Encounter (Signed)
Noted  

## 2021-06-13 ENCOUNTER — Other Ambulatory Visit: Payer: Self-pay | Admitting: Family Medicine

## 2021-06-13 DIAGNOSIS — G8929 Other chronic pain: Secondary | ICD-10-CM

## 2021-06-14 ENCOUNTER — Telehealth: Payer: Self-pay | Admitting: Family Medicine

## 2021-06-14 NOTE — Telephone Encounter (Signed)
Prednisone is not something that should be refilled recurrently.  It is typically a one-time treatment to see if it will reduce the inflammation enough to take the issue away.  It is not something that I would want her to take on a repeated basis due to potential for long-term side effects.

## 2021-06-14 NOTE — Telephone Encounter (Signed)
Noted. Refill has been refused

## 2021-06-14 NOTE — Telephone Encounter (Signed)
Patient was recently prescribed predniSONE (DELTASONE) 20 MG tablet for her shoulder pain and it has been effective for her pain.She is requesting a refill to be sent to the [facility] on [address]Please call her at [phone] with an update.

## 2021-06-27 ENCOUNTER — Other Ambulatory Visit: Payer: Self-pay | Admitting: Family Medicine

## 2021-06-27 DIAGNOSIS — M5412 Radiculopathy, cervical region: Secondary | ICD-10-CM | POA: Insufficient documentation

## 2021-06-27 DIAGNOSIS — S46011A Strain of muscle(s) and tendon(s) of the rotator cuff of right shoulder, initial encounter: Secondary | ICD-10-CM | POA: Diagnosis not present

## 2021-06-27 DIAGNOSIS — Z1231 Encounter for screening mammogram for malignant neoplasm of breast: Secondary | ICD-10-CM

## 2021-07-09 ENCOUNTER — Other Ambulatory Visit: Payer: Self-pay | Admitting: Family Medicine

## 2021-07-09 DIAGNOSIS — I480 Paroxysmal atrial fibrillation: Secondary | ICD-10-CM

## 2021-07-21 DIAGNOSIS — M5412 Radiculopathy, cervical region: Secondary | ICD-10-CM | POA: Diagnosis not present

## 2021-07-27 ENCOUNTER — Other Ambulatory Visit: Payer: Self-pay | Admitting: Family Medicine

## 2021-07-27 DIAGNOSIS — H35372 Puckering of macula, left eye: Secondary | ICD-10-CM | POA: Diagnosis not present

## 2021-07-28 ENCOUNTER — Ambulatory Visit
Admission: RE | Admit: 2021-07-28 | Discharge: 2021-07-28 | Disposition: A | Discharge status: Home / Self Care | Payer: Medicare Other | Source: Ambulatory Visit | Attending: Family Medicine | Admitting: Family Medicine

## 2021-07-28 ENCOUNTER — Other Ambulatory Visit: Payer: Self-pay

## 2021-07-28 DIAGNOSIS — Z1231 Encounter for screening mammogram for malignant neoplasm of breast: Secondary | ICD-10-CM | POA: Diagnosis not present

## 2021-08-11 DIAGNOSIS — M5412 Radiculopathy, cervical region: Secondary | ICD-10-CM | POA: Diagnosis not present

## 2021-09-06 ENCOUNTER — Ambulatory Visit: Payer: Medicare Other | Admitting: Family Medicine

## 2021-09-06 ENCOUNTER — Telehealth: Payer: Self-pay | Admitting: Family Medicine

## 2021-09-06 NOTE — Telephone Encounter (Signed)
Gail Pierce called and said she has been trying to call the office for days. She wanted to cancel her appointment for today at 9am, because of the cold. Gail Pierce states she will not come out in the cold, and would like to know when Dr Birdie Sons would like to see her.

## 2021-09-06 NOTE — Telephone Encounter (Signed)
Patient called back and I informed the front to schedule her a follow up with another provider because the provider will not be back until february or she can wait and schedule with the provider when he returns.  Gail Pierce,cma

## 2021-10-27 ENCOUNTER — Encounter: Payer: Self-pay | Admitting: Cardiology

## 2021-10-27 ENCOUNTER — Ambulatory Visit: Payer: Medicare Other | Admitting: Cardiology

## 2021-10-27 VITALS — BP 128/64 | HR 78 | Ht 59.0 in | Wt 144.0 lb

## 2021-10-27 DIAGNOSIS — E7800 Pure hypercholesterolemia, unspecified: Secondary | ICD-10-CM

## 2021-10-27 DIAGNOSIS — I10 Essential (primary) hypertension: Secondary | ICD-10-CM | POA: Diagnosis not present

## 2021-10-27 DIAGNOSIS — I4819 Other persistent atrial fibrillation: Secondary | ICD-10-CM

## 2021-10-27 NOTE — Patient Instructions (Addendum)
Medication Instructions:  Your physician recommends that you continue on your current medications as directed. Please refer to the Current Medication list given to you today.  MAKE SURE TO TAKE YOUR ELIQUIS TWICE A DAY  *If you need a refill on your cardiac medications before your next appointment, please call your pharmacy*   Lab Work: None ordered If you have labs (blood work) drawn today and your tests are completely normal, you will receive your results only by: MyChart Message (if you have MyChart) OR A paper copy in the mail If you have any lab test that is abnormal or we need to change your treatment, we will call you to review the results.   Testing/Procedures: None ordered   Follow-Up: At [facility], you and your health needs are our priority.  As part of our continuing mission to provide you with exceptional heart care, we have created designated Provider Care Teams.  These Care Teams include your primary Cardiologist (physician) and Advanced Practice Providers (APPs -  Physician Assistants and Nurse Practitioners) who all work together to provide you with the care you need, when you need it.  We recommend signing up for the patient portal called "MyChart".  Sign up information is provided on this After Visit Summary.  MyChart is used to connect with patients for Virtual Visits (Telemedicine).  Patients are able to view lab/test results, encounter notes, upcoming appointments, etc.  Non-urgent messages can be sent to your provider as well.   To learn more about what you can do with MyChart, go to [email or web address].    Your next appointment:   6 month(s)  The format for your next appointment:   In Person  Provider:   You may see Debbe Odea MD or one of the following Advanced Practice Providers on your designated Care Team:   Nicolasa Ducking, NP Eula Listen, PA-C Cadence Fransico Michael, [place]    Other Instructions

## 2021-10-27 NOTE — Progress Notes (Signed)
Cardiology Office Note:    Date:  10/27/2021   ID:  Gail Pierce, DOB 1931/02/17, MRN [id number]  PCP:  Leone Haven, MD  [place] Cardiologist:  None  [facility] Electrophysiologist:  None   Referring MD: Leone Haven, MD   Chief Complaint  Patient presents with   Other    6 month follow up -- Meds reviewed verbally with patient.     History of Present Illness:    Gail Pierce is a 88 y.o. female with a hx of permanent A. fib, hypertension, stroke who presents for follow-up.  Patient presents for follow-up due to history of atrial fibrillation.  She takes Eliquis once a day.  Denies any bleeding issues, denies any falls.  Denies palpitations.  She feels well, blood pressures have been well controlled of late.  She monitors her diet closely.   Prior notes,  echo [id number] normal systolic function, EF 60 to 65% Stroke in August 2021.  Head CT October 2021 showed old infarct.  Past Medical History:  Diagnosis Date   Allergy    COVID-19    02/06/21   Hx of diverticulitis of colon    Hypertension     Past Surgical History:  Procedure Laterality Date   BREAST EXCISIONAL BIOPSY Left 1980   SEPTOPLASTY     TONSILLECTOMY      Current Medications: Current Meds  Medication Sig   albuterol (VENTOLIN HFA) 108 (90 Base) MCG/ACT inhaler Inhale 1-2 puffs into the lungs every 6 (six) hours as needed for wheezing or shortness of breath.   alendronate (FOSAMAX) 70 MG tablet Take 1 tablet (70 mg total) by mouth every 7 (seven) days. Take with a full glass of water on an empty stomach.   amLODipine (NORVASC) 5 MG tablet Take 1 tablet (5 mg total) by mouth in the morning and at bedtime.   Ascorbic Acid (VITAMIN C) 100 MG tablet Take 100 mg by mouth daily.   budesonide-formoterol (SYMBICORT) 160-4.5 MCG/ACT inhaler Inhale 2 puffs into the lungs 2 (two) times daily. Rinse mouth after use   Calcium Carbonate-Vitamin D (CALCIUM 500/D PO) Take by mouth.    Cholecalciferol (VITAMIN D3) 1000 units CAPS Take by mouth.   ciclopirox (PENLAC) 8 % solution Apply over nail and surrounding skin. Apply daily over previous coat. After seven (7) days, may remove with alcohol and continue cycle.   Corn Dextrin (EASY FIBER PO) Take by mouth.   Cyanocobalamin 2500 MCG CHEW Chew by mouth.   DHA-EPA-Flaxseed Oil-Vitamin E (THERA TEARS NUTRITION PO) Take by mouth.   ELIQUIS 2.5 MG TABS tablet Take 1 tablet by mouth twice daily   Glucosamine-Chondroit-Vit C-Mn (GLUCOSAMINE 1500 COMPLEX PO) Take by mouth.   Multiple Vitamins-Minerals (CENTRUM ADULTS PO) Take by mouth.   predniSONE (DELTASONE) 20 MG tablet Take 2 tablets (40 mg total) by mouth daily with breakfast.   rosuvastatin (CRESTOR) 10 MG tablet Take 1 tablet by mouth once daily   vitamin A 8000 UNIT capsule Take 8,000 Units by mouth daily.     Allergies:   Patient has no known allergies.   Social History   Socioeconomic History   Marital status: Widowed    Spouse name: Not on file   Number of children: Not on file   Years of education: Not on file   Highest education level: Not on file  Occupational History   Not on file  Tobacco Use   Smoking status: Former   Smokeless tobacco: Never  Vaping Use  Vaping Use: Never used  Substance and Sexual Activity   Alcohol use: Yes    Comment: OCC   Drug use: No   Sexual activity: Not on file  Other Topics Concern   Not on file  Social History Narrative   Not on file   Social Determinants of Health   Financial Resource Strain: Not on file  Food Insecurity: Not on file  Transportation Needs: Not on file  Physical Activity: Not on file  Stress: Not on file  Social Connections: Not on file     Family History: The patient's family history includes Heart attack in her father; Leukemia in her mother. There is no history of Breast cancer.  ROS:   Please see the history of present illness.     All other systems reviewed and are  negative.  EKGs/Labs/Other Studies Reviewed:    The following studies were reviewed today:   EKG:  EKG is  ordered today.  The ekg ordered today demonstrates A. fib ablation, heart rate 78  Recent Labs: 05/12/2021: Hemoglobin 13.2; Platelets 213.0  Recent Lipid Panel    Component Value Date/Time   CHOL 163 07/21/2020 0956   TRIG 93.0 07/21/2020 0956   HDL 66.90 07/21/2020 0956   CHOLHDL 2 07/21/2020 0956   VLDL 18.6 07/21/2020 0956   LDLCALC 78 07/21/2020 0956   LDLDIRECT 40.0 08/09/2020 0957     Risk Assessment/Calculations:      Physical Exam:    VS:  BP 128/64 (BP Location: Left Arm, Patient Position: Sitting, Cuff Size: Normal)    Pulse 78    Ht 4\' 11"  (1.499 m)    Wt 144 lb (65.3 kg)    SpO2 98%    BMI 29.08 kg/m     Wt Readings from Last 3 Encounters:  10/27/21 144 lb (65.3 kg)  06/03/21 135 lb (61.2 kg)  05/12/21 133 lb (60.3 kg)     GEN:  Well nourished, well developed in no acute distress HEENT: Normal NECK: No JVD; No carotid bruits LYMPHATICS: No lymphadenopathy CARDIAC: Irregular irregular, nontachycardic RESPIRATORY:  Clear to auscultation without rales, wheezing or rhonchi  ABDOMEN: Soft, non-tender, non-distended MUSCULOSKELETAL:  No edema; No deformity  SKIN: Warm and dry NEUROLOGIC:  Alert and oriented x 3 PSYCHIATRIC:  Normal affect   ASSESSMENT:    1. Persistent atrial fibrillation ([place])   2. Primary hypertension   3. Pure hypercholesterolemia     PLAN:    In order of problems listed above:  Permanent atrial fibrillation, CHA2DS2-VASc 5 (htn, age, gender, stroke).  Advised to take Eliquis twice daily.  Continue Eliquis 2.5 mg twice daily.  Heart rate controlled off AV nodal agents. Hypertension, BP controlled.  Continue amlodipine.   Hyperlipidemia, Crestor.  Follow-up in 6 months   Medication Adjustments/Labs and Tests Ordered: Current medicines are reviewed at length with the patient today.  Concerns regarding medicines are  outlined above.  Orders Placed This Encounter  Procedures   EKG 12-Lead    No orders of the defined types were placed in this encounter.    Patient Instructions  Medication Instructions:  Your physician recommends that you continue on your current medications as directed. Please refer to the Current Medication list given to you today.  MAKE SURE TO TAKE YOUR ELIQUIS TWICE A DAY  *If you need a refill on your cardiac medications before your next appointment, please call your pharmacy*   Lab Work: None ordered If you have labs (blood work) drawn today and your  tests are completely normal, you will receive your results only by: MyChart Message (if you have MyChart) OR A paper copy in the mail If you have any lab test that is abnormal or we need to change your treatment, we will call you to review the results.   Testing/Procedures: None ordered   Follow-Up: At [facility], you and your health needs are our priority.  As part of our continuing mission to provide you with exceptional heart care, we have created designated Provider Care Teams.  These Care Teams include your primary Cardiologist (physician) and Advanced Practice Providers (APPs -  Physician Assistants and Nurse Practitioners) who all work together to provide you with the care you need, when you need it.  We recommend signing up for the patient portal called "MyChart".  Sign up information is provided on this After Visit Summary.  MyChart is used to connect with patients for Virtual Visits (Telemedicine).  Patients are able to view lab/test results, encounter notes, upcoming appointments, etc.  Non-urgent messages can be sent to your provider as well.   To learn more about what you can do with MyChart, go to [email or web address].    Your next appointment:   6 month(s)  The format for your next appointment:   In Person  Provider:   You may see Kate Sable MD or one of the following Advanced Practice  Providers on your designated Care Team:   Murray Hodgkins, NP Christell Faith, PA-C Cadence Kathlen Mody, [place]    Other Instructions     Signed, Kate Sable, MD  10/27/2021 12:15 PM    [place]

## 2021-10-29 ENCOUNTER — Telehealth: Payer: Self-pay | Admitting: Family Medicine

## 2021-11-07 MED ORDER — ROSUVASTATIN CALCIUM 20 MG PO TABS: 10.0000 mg | ORAL_TABLET | Freq: Every day | ORAL | 1 refills | Status: DC

## 2021-11-07 NOTE — Telephone Encounter (Signed)
Pt called in stating that her pharmacy advise her that they are out of medication (rosuvastatin (CRESTOR) 10 MG tablet). Pharmacy advise pt to ask provider to see if there is a substitution for medication (rosuvastatin (CRESTOR) 10 MG tablet). Pt stated that she think the pharmacy called to other pharmacy to see if they had medication available. Pt stated that she hasn't taking the medication in a week. Pt requesting callback

## 2021-11-07 NOTE — Telephone Encounter (Signed)
I sent Crestor 20 mg in.  She will split this in half to get her 10 mg dose.  Please inform her of this.

## 2021-11-07 NOTE — Telephone Encounter (Signed)
I called and spoke with the patients daughter and informed her that the 20 mg of creator was sent to the pharmacy and the patient is to break it half for a 10 mg dose and she understood and will pickup.  Jeanmarc Viernes,cma

## 2021-11-15 ENCOUNTER — Ambulatory Visit (INDEPENDENT_AMBULATORY_CARE_PROVIDER_SITE_OTHER): Payer: Medicare Other | Admitting: Family Medicine

## 2021-11-15 ENCOUNTER — Encounter: Payer: Self-pay | Admitting: Family Medicine

## 2021-11-15 ENCOUNTER — Other Ambulatory Visit: Payer: Self-pay

## 2021-11-15 VITALS — BP 130/80 | HR 82 | Temp 98.2°F | Ht 59.0 in | Wt 146.2 lb

## 2021-11-15 DIAGNOSIS — R7989 Other specified abnormal findings of blood chemistry: Secondary | ICD-10-CM

## 2021-11-15 DIAGNOSIS — I10 Essential (primary) hypertension: Secondary | ICD-10-CM | POA: Diagnosis not present

## 2021-11-15 DIAGNOSIS — W19XXXA Unspecified fall, initial encounter: Secondary | ICD-10-CM | POA: Diagnosis not present

## 2021-11-15 DIAGNOSIS — R600 Localized edema: Secondary | ICD-10-CM

## 2021-11-15 DIAGNOSIS — M8580 Other specified disorders of bone density and structure, unspecified site: Secondary | ICD-10-CM | POA: Diagnosis not present

## 2021-11-15 DIAGNOSIS — E785 Hyperlipidemia, unspecified: Secondary | ICD-10-CM

## 2021-11-15 LAB — CBC
HCT: 39.9 % (ref 36.0–46.0)
Hemoglobin: 13.2 g/dL (ref 12.0–15.0)
MCHC: 33.2 g/dL (ref 30.0–36.0)
MCV: 91.4 fl (ref 78.0–100.0)
Platelets: 230 10*3/uL (ref 150.0–400.0)
RBC: 4.37 Mil/uL (ref 3.87–5.11)
RDW: 13.8 % (ref 11.5–15.5)
WBC: 5.7 10*3/uL (ref 4.0–10.5)

## 2021-11-15 LAB — COMPREHENSIVE METABOLIC PANEL
ALT: 23 U/L (ref 0–35)
AST: 32 U/L (ref 0–37)
Albumin: 4.7 g/dL (ref 3.5–5.2)
Alkaline Phosphatase: 92 U/L (ref 39–117)
BUN: 10 mg/dL (ref 6–23)
CO2: 26 mEq/L (ref 19–32)
Calcium: 10.6 mg/dL — ABNORMAL HIGH (ref 8.4–10.5)
Chloride: 101 mEq/L (ref 96–112)
Creatinine, Ser: 0.81 mg/dL (ref 0.40–1.20)
GFR: 64.89 mL/min (ref >60.00)
Glucose, Bld: 105 mg/dL — ABNORMAL HIGH (ref 70–99)
Potassium: 4.5 mEq/L (ref 3.5–5.1)
Sodium: 136 mEq/L (ref 135–145)
Total Bilirubin: 0.6 mg/dL (ref 0.2–1.2)
Total Protein: 7.1 g/dL (ref 6.0–8.3)

## 2021-11-15 LAB — VITAMIN D 25 HYDROXY (VIT D DEFICIENCY, FRACTURES): VITD: 81.54 ng/mL (ref 30.00–100.00)

## 2021-11-15 LAB — LIPID PANEL
Cholesterol: 127 mg/dL (ref 0–200)
HDL: 84.3 mg/dL (ref >39.00)
LDL Cholesterol: 31 mg/dL (ref 0–99)
NonHDL: 42.51
Total CHOL/HDL Ratio: 2
Triglycerides: 58 mg/dL (ref 0.0–149.0)
VLDL: 11.6 mg/dL (ref 0.0–40.0)

## 2021-11-15 LAB — TSH: TSH: 4.84 u[IU]/mL (ref 0.35–5.50)

## 2021-11-15 NOTE — Assessment & Plan Note (Signed)
Check vitamin D. 

## 2021-11-15 NOTE — Assessment & Plan Note (Signed)
Continue Fosamax.  Check vitamin D and calcium levels. ?

## 2021-11-15 NOTE — Progress Notes (Signed)
?Gail Rumps, MD ?Phone: [phone] ? ?Gail Pierce is a 88 y.o. female who presents today for f/u. ? ?HYPERTENSION ?Disease Monitoring ?Home BP Monitoring 110s-130s/50s-70s Chest pain- no    Dyspnea- no ?Medications ?Compliance-  taking amlodipine.  Edema- yes, bilateral, resolves over night ?BMET ?   ?Component Value Date/Time  ? NA 139 09/06/2020 0813  ? K 4.2 09/06/2020 0813  ? CL 105 09/06/2020 0813  ? CO2 27 09/06/2020 0813  ? GLUCOSE 106 (H) 09/06/2020 0813  ? BUN 11 09/06/2020 0813  ? CREATININE 0.71 09/06/2020 0813  ? CREATININE 0.76 02/09/2017 1613  ? CALCIUM 10.4 09/06/2020 0813  ? ?HYPERLIPIDEMIA ?Symptoms ?Chest pain on exertion:  no   Leg claudication:   no ?Medications: ?Compliance- taking crestor Right upper quadrant pain- no  Muscle aches- no ?Lipid Panel  ?   ?Component Value Date/Time  ? CHOL 163 07/21/2020 0956  ? TRIG 93.0 07/21/2020 0956  ? HDL 66.90 07/21/2020 0956  ? CHOLHDL 2 07/21/2020 0956  ? VLDL 18.6 07/21/2020 0956  ? [place] 78 07/21/2020 0956  ? LDLDIRECT 40.0 08/09/2020 0957  ? ?Osteopenia: Patient remains on Fosamax.  No fractures.  She is taking vitamin D and calcium. ? ?Fall: Patient notes she was rushing to the door with her slippers on last week.  One of her slippers came off and she fell to the floor.  She had her knee as though had no injury.  No head injury. ? ?Social History  ? ?Tobacco Use  ?Smoking Status Former  ?Smokeless Tobacco Never  ? ? ?Current Outpatient Medications on File Prior to Visit  ?Medication Sig Dispense Refill  ? albuterol (VENTOLIN HFA) 108 (90 Base) MCG/ACT inhaler Inhale 1-2 puffs into the lungs every 6 (six) hours as needed for wheezing or shortness of breath. 18 g 2  ? alendronate (FOSAMAX) 70 MG tablet Take 1 tablet (70 mg total) by mouth every 7 (seven) days. Take with a full glass of water on an empty stomach. 13 tablet 3  ? amLODipine (NORVASC) 5 MG tablet Take 1 tablet (5 mg total) by mouth in the morning and at bedtime. 180 tablet  3  ? Ascorbic Acid (VITAMIN C) 100 MG tablet Take 100 mg by mouth daily.    ? budesonide-formoterol (SYMBICORT) 160-4.5 MCG/ACT inhaler Inhale 2 puffs into the lungs 2 (two) times daily. Rinse mouth after use 10.2 each 2  ? Calcium Carbonate-Vitamin D (CALCIUM 500/D PO) Take by mouth.    ? Cholecalciferol (VITAMIN D3) 1000 units CAPS Take by mouth.    ? ciclopirox (PENLAC) 8 % solution Apply over nail and surrounding skin. Apply daily over previous coat. After seven (7) days, may remove with alcohol and continue cycle. 6 mL 1  ? Corn Dextrin (EASY FIBER PO) Take by mouth.    ? Cyanocobalamin 2500 MCG CHEW Chew by mouth.    ? DHA-EPA-Flaxseed Oil-Vitamin E (THERA TEARS NUTRITION PO) Take by mouth.    ? ELIQUIS 2.5 MG TABS tablet Take 1 tablet by mouth twice daily 180 tablet 0  ? Glucosamine-Chondroit-Vit C-Mn (GLUCOSAMINE 1500 COMPLEX PO) Take by mouth.    ? Multiple Vitamins-Minerals (CENTRUM ADULTS PO) Take by mouth.    ? predniSONE (DELTASONE) 20 MG tablet Take 2 tablets (40 mg total) by mouth daily with breakfast. 10 tablet 0  ? rosuvastatin (CRESTOR) 20 MG tablet Take 0.5 tablets (10 mg total) by mouth daily. 45 tablet 1  ? vitamin A 8000 UNIT capsule Take 8,000 Units by mouth  daily.    ? ?No current facility-administered medications on file prior to visit.  ? ? ? ?ROS see history of present illness ? ?Objective ? ?Physical Exam ?Vitals:  ? 11/15/21 1153  ?BP: 130/80  ?Pulse: 82  ?Temp: 98.2 ?F (36.8 ?C)  ?SpO2: 97%  ? ? ?BP Readings from Last 3 Encounters:  ?11/15/21 130/80  ?10/27/21 128/64  ?06/03/21 140/80  ? ?Wt Readings from Last 3 Encounters:  ?11/15/21 146 lb 3.2 oz (66.3 kg)  ?10/27/21 144 lb (65.3 kg)  ?06/03/21 135 lb (61.2 kg)  ? ? ?Physical Exam ?Constitutional:   ?   General: She is not in acute distress. ?   Appearance: She is not diaphoretic.  ?Cardiovascular:  ?   Rate and Rhythm: Normal rate and regular rhythm.  ?   Heart sounds: Normal heart sounds.  ?Pulmonary:  ?   Effort: Pulmonary effort  is normal.  ?   Breath sounds: Normal breath sounds.  ?Musculoskeletal:  ?   Comments: 2+ pitting edema lower legs  ?Skin: ?   General: Skin is warm and dry.  ?Neurological:  ?   Mental Status: She is alert.  ? ? ? ?Assessment/Plan: Please see individual problem list. ? ?Problem List Items Addressed This Visit   ? ? Falls  ?  Discussed taking her time when she is moving around.  Discussed wearing well fitting shoes. ?  ?  ? High serum vitamin D  ?  Check vitamin D. ?  ?  ? Hyperlipidemia  ?  Check lipid panel.  Continue Crestor 10 mg once daily. ?  ?  ? Relevant Orders  ? Lipid panel  ? Comp Met (CMET)  ? Hypertension - Primary  ?  Well-controlled.  She will continue amlodipine 5 mg once daily though we will work-up her swelling and if there is no cause on lab work we will stop her amlodipine and start a different medication. ?  ?  ? Relevant Orders  ? Comp Met (CMET)  ? Osteopenia  ?  Continue Fosamax.  Check vitamin D and calcium levels. ?  ?  ? Relevant Orders  ? Vitamin D (25 hydroxy)  ? ?Other Visit Diagnoses   ? ? Bilateral leg edema      ? Relevant Orders  ? Comp Met (CMET)  ? TSH  ? CBC  ? ?  ? ? ?Return in about 6 months (around 05/18/2022). ? ?This visit occurred during the SARS-CoV-2 public health emergency.  Safety protocols were in place, including screening questions prior to the visit, additional usage of staff PPE, and extensive cleaning of exam room while observing appropriate contact time as indicated for disinfecting solutions.  ? ? ?Gail Rumps, MD ?[place] ? ?

## 2021-11-15 NOTE — Assessment & Plan Note (Signed)
Well-controlled.  She will continue amlodipine 5 mg once daily though we will work-up her swelling and if there is no cause on lab work we will stop her amlodipine and start a different medication. ?

## 2021-11-15 NOTE — Assessment & Plan Note (Signed)
Check lipid panel.  Continue Crestor 10 mg once daily. 

## 2021-11-15 NOTE — Patient Instructions (Signed)
Nice to see you. We will check lab work today. 

## 2021-11-15 NOTE — Assessment & Plan Note (Signed)
Discussed taking her time when she is moving around.  Discussed wearing well fitting shoes. ?

## 2021-11-24 ENCOUNTER — Telehealth: Payer: Self-pay

## 2021-11-24 MED ORDER — HYDROCHLOROTHIAZIDE 12.5 MG PO CAPS: 12.5000 mg | ORAL_CAPSULE | Freq: Every day | ORAL | 1 refills | Status: DC

## 2021-11-24 NOTE — Telephone Encounter (Signed)
Hydrochlorothiazide sent to the patient's pharmacy.  She will discontinue the amlodipine and the next day start hydrochlorothiazide.  She needs a nurse blood pressure check and lab work in 1 month.  Orders placed.  She can continue with her current dose of vitamin D. ?

## 2021-11-24 NOTE — Telephone Encounter (Signed)
Advise pt daughter of below note.... Schedule Pt appt for BP Check and lab work on 12/27/2021 at 9:45am. Pt daughter understood.  ?

## 2021-11-24 NOTE — Telephone Encounter (Signed)
Lvm for pt to return call to f/u on new prescription that was sent to pharmacy and more information that Dr.Sonnenberg has advised: ? ?Hydrochlorothiazide sent to the patient's pharmacy.  She will discontinue the amlodipine and the next day start hydrochlorothiazide.  She needs a nurse blood pressure check and lab work in 1 month.  Orders placed.  She can continue with her current dose of vitamin D. ?

## 2021-11-24 NOTE — Telephone Encounter (Signed)
Pt daughter returned call and I read the message to daughter and daughter said that pt is taking 100 IU for vitamin D and does not take separate in calcium, but in centrum silver she is  taking 300 mg. Pt daughter did not ask about amlodpine. She said she will call back when she speaks to pt ?

## 2021-11-24 NOTE — Telephone Encounter (Signed)
Lvm for daughter to return call to f/u on how much Vit D, Calcium pt is taking as well as if pt is agreeable in switching amlodpine to another medication.  ?

## 2021-11-24 NOTE — Telephone Encounter (Signed)
Pt daughter called back and said pt is ok with changing amlodpine to another medication ?

## 2021-11-24 NOTE — Addendum Note (Signed)
Addended by: Glori Luis on: 11/24/2021 03:54 PM ? ? Modules accepted: Orders ? ?

## 2021-11-25 NOTE — Telephone Encounter (Signed)
Pt called in to see what medication she need to stop.... Advise Pt of below note.... Pt understood direction.... ?

## 2021-11-25 NOTE — Telephone Encounter (Signed)
Noted. Thanks.

## 2021-12-05 ENCOUNTER — Other Ambulatory Visit: Payer: Self-pay | Admitting: Family Medicine

## 2021-12-05 DIAGNOSIS — I480 Paroxysmal atrial fibrillation: Secondary | ICD-10-CM

## 2021-12-27 ENCOUNTER — Ambulatory Visit (INDEPENDENT_AMBULATORY_CARE_PROVIDER_SITE_OTHER): Payer: Medicare Other

## 2021-12-27 DIAGNOSIS — I10 Essential (primary) hypertension: Secondary | ICD-10-CM

## 2021-12-27 LAB — BASIC METABOLIC PANEL
BUN: 13 mg/dL (ref 6–23)
CO2: 25 mEq/L (ref 19–32)
Calcium: 10.5 mg/dL (ref 8.4–10.5)
Chloride: 94 mEq/L — ABNORMAL LOW (ref 96–112)
Creatinine, Ser: 0.75 mg/dL (ref 0.40–1.20)
GFR: 71.11 mL/min (ref >60.00)
Glucose, Bld: 101 mg/dL — ABNORMAL HIGH (ref 70–99)
Potassium: 4.3 mEq/L (ref 3.5–5.1)
Sodium: 130 mEq/L — ABNORMAL LOW (ref 135–145)

## 2021-12-27 NOTE — Progress Notes (Signed)
Patient came in today for blood pressure check done in left arm with normal sized cuff while patient sitting down. Pt stated that she was in such a hurry to get here when she woke up that she did not take her medications & was not sure if she could take before labs. I let patient know to always take medications unless told otherwise.  ?

## 2021-12-30 ENCOUNTER — Telehealth: Payer: Self-pay | Admitting: Family Medicine

## 2021-12-30 ENCOUNTER — Other Ambulatory Visit: Payer: Self-pay

## 2021-12-30 DIAGNOSIS — I10 Essential (primary) hypertension: Secondary | ICD-10-CM

## 2021-12-30 MED ORDER — VALSARTAN 80 MG PO TABS: 80.0000 mg | ORAL_TABLET | Freq: Every day | ORAL | 1 refills | Status: DC

## 2021-12-30 NOTE — Telephone Encounter (Signed)
Pt daughter returning call 

## 2021-12-30 NOTE — Telephone Encounter (Signed)
I called and LVM for patient or daughter to call back for lab results. Kadeem Hyle,cma  ?

## 2022-01-02 NOTE — Telephone Encounter (Signed)
Pt daughter would like for you to call her in reference to the medication that was being switch for pt... Pt daughter requesting callback ?

## 2022-01-03 ENCOUNTER — Other Ambulatory Visit: Payer: Medicare Other

## 2022-01-03 NOTE — Telephone Encounter (Signed)
Patient was called and her lab appointment was rescheduled.  Gail Pierce,cma  ?

## 2022-01-17 ENCOUNTER — Other Ambulatory Visit (INDEPENDENT_AMBULATORY_CARE_PROVIDER_SITE_OTHER): Payer: Medicare Other

## 2022-01-17 ENCOUNTER — Telehealth: Payer: Self-pay | Admitting: Family Medicine

## 2022-01-17 DIAGNOSIS — I10 Essential (primary) hypertension: Secondary | ICD-10-CM

## 2022-01-17 LAB — BASIC METABOLIC PANEL
BUN: 15 mg/dL (ref 6–23)
CO2: 26 mEq/L (ref 19–32)
Calcium: 10.2 mg/dL (ref 8.4–10.5)
Chloride: 99 mEq/L (ref 96–112)
Creatinine, Ser: 0.84 mg/dL (ref 0.40–1.20)
GFR: 62.04 mL/min (ref >60.00)
Glucose, Bld: 98 mg/dL (ref 70–99)
Potassium: 4.2 mEq/L (ref 3.5–5.1)
Sodium: 133 mEq/L — ABNORMAL LOW (ref 135–145)

## 2022-01-17 NOTE — Telephone Encounter (Signed)
Attempted to schedule AWV. Unable to LVM.  Will try at later time.  

## 2022-01-18 ENCOUNTER — Other Ambulatory Visit: Payer: Self-pay

## 2022-01-18 DIAGNOSIS — E785 Hyperlipidemia, unspecified: Secondary | ICD-10-CM

## 2022-01-18 MED ORDER — ROSUVASTATIN CALCIUM 20 MG PO TABS: 10.0000 mg | ORAL_TABLET | Freq: Every day | ORAL | 1 refills | Status: DC

## 2022-02-24 ENCOUNTER — Other Ambulatory Visit: Payer: Self-pay | Admitting: Family

## 2022-02-24 DIAGNOSIS — I480 Paroxysmal atrial fibrillation: Secondary | ICD-10-CM

## 2022-03-03 ENCOUNTER — Telehealth: Payer: Self-pay | Admitting: Family Medicine

## 2022-03-03 ENCOUNTER — Other Ambulatory Visit: Payer: Self-pay

## 2022-03-03 DIAGNOSIS — I480 Paroxysmal atrial fibrillation: Secondary | ICD-10-CM

## 2022-03-03 MED ORDER — APIXABAN 2.5 MG PO TABS: 2.5000 mg | ORAL_TABLET | Freq: Two times a day (BID) | ORAL | 0 refills | Status: DC

## 2022-03-29 DIAGNOSIS — M79641 Pain in right hand: Secondary | ICD-10-CM | POA: Diagnosis not present

## 2022-04-03 ENCOUNTER — Telehealth: Payer: Self-pay

## 2022-04-03 NOTE — Telephone Encounter (Signed)
Patient states she tripped going to the bathroom during the night, over the weekend, and injured three of her fingers.  Patient states she has seen Jennet Maduro, PA-C, at [facility] in [place].  Patient states her fingers are not broken.  Patient states she is scheduled to see Jennet Maduro, PA-C, tomorrow to removed the bandage.  Patient states she wants to be sure Dr. Marikay Alar is aware of her situation.

## 2022-04-03 NOTE — Telephone Encounter (Signed)
I have noted this. It sounds like she needs to keep the follow-up with Ramos. Jenate mentioned that the patient stated that I tried to call her about this, though I have not tried to contact the patient.

## 2022-04-03 NOTE — Telephone Encounter (Signed)
Patient returned our call.  Please call. 

## 2022-04-03 NOTE — Telephone Encounter (Signed)
Patient called back  I advised her of note below.  Patient said thank you and ok.  She just wanted to make sure that Dr Birdie Sons did not need to see her before her appointment.

## 2022-04-04 DIAGNOSIS — M79641 Pain in right hand: Secondary | ICD-10-CM | POA: Diagnosis not present

## 2022-04-17 ENCOUNTER — Telehealth: Payer: Self-pay | Admitting: Family Medicine

## 2022-04-17 NOTE — Telephone Encounter (Signed)
Copied from CRM [id number]. Topic: Medicare AWV >> Apr 17, 2022 11:38 AM Payton Doughty wrote: Reason for CRM: Left message for patient to schedule Annual Wellness Visit.  Please schedule with Nurse Health Advisor Denisa O'Brien-Blaney, LPN at [facility]. This appt can be telephone or office visit.  Please call [phone] ask for [facility]

## 2022-04-27 ENCOUNTER — Encounter: Payer: Self-pay | Admitting: Cardiology

## 2022-04-27 ENCOUNTER — Ambulatory Visit: Payer: Medicare Other | Admitting: Cardiology

## 2022-04-27 VITALS — BP 162/90 | HR 78 | Ht 59.0 in | Wt 142.0 lb

## 2022-04-27 DIAGNOSIS — I10 Essential (primary) hypertension: Secondary | ICD-10-CM | POA: Diagnosis not present

## 2022-04-27 DIAGNOSIS — I4821 Permanent atrial fibrillation: Secondary | ICD-10-CM | POA: Diagnosis not present

## 2022-04-27 DIAGNOSIS — E7800 Pure hypercholesterolemia, unspecified: Secondary | ICD-10-CM | POA: Diagnosis not present

## 2022-04-27 NOTE — Progress Notes (Signed)
Cardiology Office Note:    Date:  04/27/2022   ID:  Gail Pierce, DOB 1931/09/11, MRN [id number]  PCP:  Glori Luis, MD  [facility] Cardiologist:  None  [facility] Electrophysiologist:  None   Referring MD: Glori Luis, MD   Chief Complaint  Patient presents with   6 month follow up     "Doing well." Medications reviewed by the patient's bottles.     History of Present Illness:    Gail Pierce is a 88 y.o. female with a hx of permanent A. fib, hypertension, stroke who presents for follow-up.  Being seen for atrial fibrillation, previously was not taking Eliquis as prescribed.  Has been on Eliquis twice daily as advised, has not missed any doses, denies any bleeding issues.  Was on amlodipine for blood pressure, developed dizziness this was changed to HCTZ 12.5 mg daily.  States feeling much better, blood pressures at home around 130s systolic.  Denies palpitations or dizziness.  Prior notes,  echo 08/2020 normal systolic function, EF 60 to 65% Stroke in August 2021.  Head CT October 2021 showed old infarct.  Past Medical History:  Diagnosis Date   Allergy    COVID-19    02/06/21   Hx of diverticulitis of colon    Hypertension     Past Surgical History:  Procedure Laterality Date   BREAST EXCISIONAL BIOPSY Left 1980   SEPTOPLASTY     TONSILLECTOMY      Current Medications: Current Meds  Medication Sig   albuterol (VENTOLIN HFA) 108 (90 Base) MCG/ACT inhaler Inhale 1-2 puffs into the lungs every 6 (six) hours as needed for wheezing or shortness of breath.   apixaban (ELIQUIS) 2.5 MG TABS tablet Take 1 tablet (2.5 mg total) by mouth 2 (two) times daily.   Apoaequorin (PREVAGEN) 10 MG CAPS Take 10 mg by mouth daily.   Ascorbic Acid (VITAMIN C) 100 MG tablet Take 100 mg by mouth daily.   Calcium Carbonate-Vitamin D (CALCIUM 500/D PO) Take by mouth.   Cholecalciferol (VITAMIN D3) 1000 units CAPS Take by mouth.   Corn Dextrin (EASY FIBER  PO) Take by mouth.   Cyanocobalamin 2500 MCG CHEW Chew by mouth.   DHA-EPA-Flaxseed Oil-Vitamin E (THERA TEARS NUTRITION PO) Take by mouth.   hydrochlorothiazide (MICROZIDE) 12.5 MG capsule Take 12.5 mg by mouth daily.   Multiple Vitamins-Minerals (CENTRUM ADULTS PO) Take by mouth.   rosuvastatin (CRESTOR) 20 MG tablet Take 10 mg by mouth in the morning and at bedtime.   vitamin A 8000 UNIT capsule Take 8,000 Units by mouth daily.     Allergies:   Patient has no known allergies.   Social History   Socioeconomic History   Marital status: Widowed    Spouse name: Not on file   Number of children: Not on file   Years of education: Not on file   Highest education level: Not on file  Occupational History   Not on file  Tobacco Use   Smoking status: Former   Smokeless tobacco: Never  Vaping Use   Vaping Use: Never used  Substance and Sexual Activity   Alcohol use: Yes    Comment: OCC   Drug use: No   Sexual activity: Not on file  Other Topics Concern   Not on file  Social History Narrative   Not on file   Social Determinants of Health   Financial Resource Strain: Low Risk  (03/25/2018)   Overall Financial Resource Strain (CARDIA)  Difficulty of Paying Living Expenses: Not hard at all  Food Insecurity: No Food Insecurity (03/25/2018)   Hunger Vital Sign    Worried About Running Out of Food in the Last Year: Never true    Ran Out of Food in the Last Year: Never true  Transportation Needs: No Transportation Needs (03/25/2018)   PRAPARE - Administrator, Civil Service (Medical): No    Lack of Transportation (Non-Medical): No  Physical Activity: Sufficiently Active (03/25/2018)   Exercise Vital Sign    Days of Exercise per Week: 7 days    Minutes of Exercise per Session: 60 min  Stress: No Stress Concern Present (03/25/2018)   [employer] of Occupational Health - Occupational Stress Questionnaire    Feeling of Stress : Not at all  Social Connections:  Moderately Isolated (03/29/2020)   Social Connection and Isolation Panel [NHANES]    Frequency of Communication with Friends and Family: More than three times a week    Frequency of Social Gatherings with Friends and Family: More than three times a week    Attends Religious Services: Never    Database administrator or Organizations: Yes    Attends Banker Meetings: 1 to 4 times per year    Marital Status: Widowed     Family History: The patient's family history includes Heart attack in her father; Leukemia in her mother. There is no history of Breast cancer.  ROS:   Please see the history of present illness.     All other systems reviewed and are negative.  EKGs/Labs/Other Studies Reviewed:    The following studies were reviewed today:   EKG:  EKG is  ordered today.  The ekg ordered today demonstrates A. fib , heart rate 78  Recent Labs: 11/15/2021: ALT 23; Hemoglobin 13.2; Platelets 230.0; TSH 4.84 01/17/2022: BUN 15; Creatinine, Ser 0.84; Potassium 4.2; Sodium 133  Recent Lipid Panel    Component Value Date/Time   CHOL 127 11/15/2021 1227   TRIG 58.0 11/15/2021 1227   HDL 84.30 11/15/2021 1227   CHOLHDL 2 11/15/2021 1227   VLDL 11.6 11/15/2021 1227   LDLCALC 31 11/15/2021 1227   LDLDIRECT 40.0 08/09/2020 0957     Risk Assessment/Calculations:      Physical Exam:    VS:  BP (!) 162/90 (BP Location: Left Arm, Patient Position: Sitting, Cuff Size: Normal)   Pulse 78   Ht 4\' 11"  (1.499 m)   Wt 142 lb (64.4 kg)   SpO2 98%   BMI 28.68 kg/m     Wt Readings from Last 3 Encounters:  04/27/22 142 lb (64.4 kg)  11/15/21 146 lb 3.2 oz (66.3 kg)  10/27/21 144 lb (65.3 kg)     GEN:  Well nourished, well developed in no acute distress HEENT: Normal NECK: No JVD; No carotid bruits CARDIAC: Irregular irregular, nontachycardic RESPIRATORY:  Clear to auscultation without rales, wheezing or rhonchi  ABDOMEN: Soft, non-tender, non-distended MUSCULOSKELETAL:  No  edema; No deformity  SKIN: Warm and dry NEUROLOGIC:  Alert and oriented x 3 PSYCHIATRIC:  Normal affect   ASSESSMENT:    1. Permanent atrial fibrillation (HCC)   2. Primary hypertension   3. Pure hypercholesterolemia    PLAN:    In order of problems listed above:  Permanent atrial fibrillation, CHA2DS2-VASc 5 (htn, age, gender, stroke).  Heart rate controlled off AV nodal agents, denies palpitations. Continue Eliquis 2.5 mg twice daily.   Hypertension, BP elevated, controlled at home.  Continue  HCTZ 12.5 mg daily, higher doses of medications cause dizziness. Hyperlipidemia, on Crestor.  Follow-up in 6 months   Medication Adjustments/Labs and Tests Ordered: Current medicines are reviewed at length with the patient today.  Concerns regarding medicines are outlined above.  Orders Placed This Encounter  Procedures   EKG 12-Lead    No orders of the defined types were placed in this encounter.    Patient Instructions  Medication Instructions:   Your physician recommends that you continue on your current medications as directed. Please refer to the Current Medication list given to you today.  *If you need a refill on your cardiac medications before your next appointment, please call your pharmacy*   Follow-Up: At [facility], you and your health needs are our priority.  As part of our continuing mission to provide you with exceptional heart care, we have created designated Provider Care Teams.  These Care Teams include your primary Cardiologist (physician) and Advanced Practice Providers (APPs -  Physician Assistants and Nurse Practitioners) who all work together to provide you with the care you need, when you need it.  We recommend signing up for the patient portal called "MyChart".  Sign up information is provided on this After Visit Summary.  MyChart is used to connect with patients for Virtual Visits (Telemedicine).  Patients are able to view lab/test results, encounter  notes, upcoming appointments, etc.  Non-urgent messages can be sent to your provider as well.   To learn more about what you can do with MyChart, go to [email or web address].    Your next appointment:   Follow up 6-12 months   The format for your next appointment:   In Person  Provider:   You may see Debbe Odea, MD or one of the following Advanced Practice Providers on your designated Care Team:   Nicolasa Ducking, NP Eula Listen, PA-C Cadence Fransico Michael, [place]    Other Instructions   Important Information About Sugar         Signed, Debbe Odea, MD  04/27/2022 12:25 PM    [facility]

## 2022-04-27 NOTE — Patient Instructions (Signed)
Medication Instructions:   Your physician recommends that you continue on your current medications as directed. Please refer to the Current Medication list given to you today.  *If you need a refill on your cardiac medications before your next appointment, please call your pharmacy*   Follow-Up: At [facility], you and your health needs are our priority.  As part of our continuing mission to provide you with exceptional heart care, we have created designated Provider Care Teams.  These Care Teams include your primary Cardiologist (physician) and Advanced Practice Providers (APPs -  Physician Assistants and Nurse Practitioners) who all work together to provide you with the care you need, when you need it.  We recommend signing up for the patient portal called "MyChart".  Sign up information is provided on this After Visit Summary.  MyChart is used to connect with patients for Virtual Visits (Telemedicine).  Patients are able to view lab/test results, encounter notes, upcoming appointments, etc.  Non-urgent messages can be sent to your provider as well.   To learn more about what you can do with MyChart, go to [email or web address].    Your next appointment:   Follow up 6-12 months   The format for your next appointment:   In Person  Provider:   You may see Debbe Odea, MD or one of the following Advanced Practice Providers on your designated Care Team:   Nicolasa Ducking, NP Eula Listen, PA-C Cadence Fransico Michael, [place]    Other Instructions   Important Information About Sugar

## 2022-05-08 ENCOUNTER — Other Ambulatory Visit: Payer: Self-pay

## 2022-05-08 ENCOUNTER — Other Ambulatory Visit: Payer: Self-pay | Admitting: Family Medicine

## 2022-05-08 ENCOUNTER — Telehealth: Payer: Self-pay

## 2022-05-08 NOTE — Telephone Encounter (Signed)
Medication sent to pharmacy.  Generoso Cropper,cma  ?

## 2022-05-08 NOTE — Telephone Encounter (Signed)
Patient needs a refill for her hydrochlorothiazide (MICROZIDE) 12.5 MG capsule.  Patient states she has 15 pills left, but sometimes it takes the pharmacy a while to get this medication.  Patient states when she took a whole pill, it made her dizzy, so now she takes half a pill in the morning and half a pill in the evening and she is not dizzy.  *Patient states her preferred pharmacy is [facility] on [address].

## 2022-05-19 NOTE — Telephone Encounter (Signed)
Patient states she is returning our call.  I read Gail Pierce, CMA's message and patient states she has already picked up her medicine.

## 2022-05-22 ENCOUNTER — Other Ambulatory Visit: Payer: Self-pay | Admitting: Family Medicine

## 2022-05-22 DIAGNOSIS — I480 Paroxysmal atrial fibrillation: Secondary | ICD-10-CM

## 2022-05-24 ENCOUNTER — Ambulatory Visit
Admission: RE | Admit: 2022-05-24 | Discharge: 2022-05-24 | Disposition: A | Discharge status: Home / Self Care | Payer: Medicare Other | Source: Ambulatory Visit | Attending: Family Medicine | Admitting: Family Medicine

## 2022-05-24 ENCOUNTER — Encounter: Payer: Self-pay | Admitting: Family Medicine

## 2022-05-24 ENCOUNTER — Ambulatory Visit (INDEPENDENT_AMBULATORY_CARE_PROVIDER_SITE_OTHER): Payer: Medicare Other | Admitting: Family Medicine

## 2022-05-24 ENCOUNTER — Telehealth: Payer: Self-pay

## 2022-05-24 VITALS — BP 120/70 | HR 80 | Temp 99.2°F | Ht 59.0 in | Wt 143.6 lb

## 2022-05-24 DIAGNOSIS — S0990XA Unspecified injury of head, initial encounter: Secondary | ICD-10-CM | POA: Insufficient documentation

## 2022-05-24 DIAGNOSIS — J449 Chronic obstructive pulmonary disease, unspecified: Secondary | ICD-10-CM

## 2022-05-24 DIAGNOSIS — S6990XA Unspecified injury of unspecified wrist, hand and finger(s), initial encounter: Secondary | ICD-10-CM | POA: Insufficient documentation

## 2022-05-24 DIAGNOSIS — R2689 Other abnormalities of gait and mobility: Secondary | ICD-10-CM | POA: Insufficient documentation

## 2022-05-24 DIAGNOSIS — S6991XA Unspecified injury of right wrist, hand and finger(s), initial encounter: Secondary | ICD-10-CM | POA: Diagnosis not present

## 2022-05-24 DIAGNOSIS — Z23 Encounter for immunization: Secondary | ICD-10-CM

## 2022-05-24 DIAGNOSIS — R911 Solitary pulmonary nodule: Secondary | ICD-10-CM | POA: Diagnosis not present

## 2022-05-24 DIAGNOSIS — I10 Essential (primary) hypertension: Secondary | ICD-10-CM

## 2022-05-24 DIAGNOSIS — I4821 Permanent atrial fibrillation: Secondary | ICD-10-CM | POA: Diagnosis not present

## 2022-05-24 LAB — BASIC METABOLIC PANEL
BUN: 17 mg/dL (ref 6–23)
CO2: 25 mEq/L (ref 19–32)
Calcium: 10.5 mg/dL (ref 8.4–10.5)
Chloride: 100 mEq/L (ref 96–112)
Creatinine, Ser: 0.79 mg/dL (ref 0.40–1.20)
GFR: 66.62 mL/min (ref >60.00)
Glucose, Bld: 95 mg/dL (ref 70–99)
Potassium: 4.3 mEq/L (ref 3.5–5.1)
Sodium: 134 mEq/L — ABNORMAL LOW (ref 135–145)

## 2022-05-24 MED ORDER — BUDESONIDE-FORMOTEROL FUMARATE 160-4.5 MCG/ACT IN AERO: 2.0000 | INHALATION_SPRAY | Freq: Two times a day (BID) | RESPIRATORY_TRACT | 2 refills | Status: DC

## 2022-05-24 MED ORDER — ALBUTEROL SULFATE HFA 108 (90 BASE) MCG/ACT IN AERS: 1.0000 | INHALATION_SPRAY | Freq: Four times a day (QID) | RESPIRATORY_TRACT | 2 refills | Status: AC | PRN

## 2022-05-24 NOTE — Telephone Encounter (Signed)
I received a call from [facility] at [facility] imaging and she stated the CT results were in the patients chart, nothing acute. I informed her to let the patient go home and let her know I will call her and she understood. Armel Rabbani,cma

## 2022-05-24 NOTE — Assessment & Plan Note (Signed)
Rate controlled.  She will continue Eliquis 2.5 mg twice daily.

## 2022-05-24 NOTE — Assessment & Plan Note (Signed)
Well-controlled.  She will continue HCTZ 12.5 mg daily and amlodipine 5 mg daily.

## 2022-05-24 NOTE — Assessment & Plan Note (Signed)
Emphysema noted on CT chest.  Presumed to have COPD based on smoking history and CT findings.  She can continue Symbicort on a daily basis.  Continue as needed albuterol.  Refills provided.

## 2022-05-24 NOTE — Assessment & Plan Note (Signed)
This occurred sometime ago.  She notes this has healed well.

## 2022-05-24 NOTE — Assessment & Plan Note (Signed)
At the end of the visit the patients granddaughter noted the patient just told her that she fell yesterday and hit her head. CT scan ordered. Discussed risk of intracranial bleeding while on eliquis and the dangers of this not being evaluated. Patient was willing to have the CT scan completed.

## 2022-05-24 NOTE — Telephone Encounter (Signed)
Noted Please see result note 

## 2022-05-24 NOTE — Assessment & Plan Note (Signed)
Deemed benign on follow-up imaging in 2022.  No further follow-up was recommended.

## 2022-05-24 NOTE — Assessment & Plan Note (Signed)
Benign neurological exam.  We will check her sodium level.  Discussed doing physical therapy for her balance though she declines this as it has not been beneficial in the past.  Discussed wearing well-fitting shoes.  Discussed removing area rugs and any elevated edges from the walkways at her house.

## 2022-05-24 NOTE — Patient Instructions (Signed)
Nice to see you. We will check lab work today and contact you with the results. 

## 2022-05-24 NOTE — Progress Notes (Signed)
Marikay Alar, MD Phone: [phone]  Gail Pierce is a 88 y.o. female who presents today for f/u.  Probable COPD: Patient reports a history of smoking in the past.  She notes chronic intermittent issues with cough that responds well to albuterol and Symbicort.  She notes it is a dry cough.  No wheezing.  No shortness of breath.  Balance difficulty: Patient notes several falls related to losing her balance.  Her granddaughter notes one of the falls occurred related to her tripping over a door jam.  She brings up the possibility of having had a stroke at some point as she had some prominent vessels in the sclera of her eye and she read about that in a handout.  She notes no issues with numbness, weakness, or vision changes.  She does have atrial fibrillation and is on Eliquis.  Hypertension: Taking amlodipine and HCTZ.  No chest pain or shortness of breath.  Hand injury: Patient notes this occurred sometime ago.  She had a cut over her fingers after she shut the hand in a door.  She notes it has healed up at this point.  She was evaluated by orthopedics for this.  Social History   Tobacco Use  Smoking Status Former  Smokeless Tobacco Never    Current Outpatient Medications on File Prior to Visit  Medication Sig Dispense Refill   alendronate (FOSAMAX) 70 MG tablet Take 1 tablet (70 mg total) by mouth every 7 (seven) days. Take with a full glass of water on an empty stomach. 13 tablet 3   amLODipine (NORVASC) 5 MG tablet Take 1 tablet by mouth daily.     Apoaequorin (PREVAGEN) 10 MG CAPS Take 10 mg by mouth daily.     Ascorbic Acid (VITAMIN C) 100 MG tablet Take 100 mg by mouth daily.     Calcium Carbonate-Vitamin D (CALCIUM 500/D PO) Take by mouth.     Cholecalciferol (VITAMIN D3) 1000 units CAPS Take by mouth.     ciclopirox (PENLAC) 8 % solution Apply over nail and surrounding skin. Apply daily over previous coat. After seven (7) days, may remove with alcohol and continue  cycle. 6 mL 1   Corn Dextrin (EASY FIBER PO) Take by mouth.     Cyanocobalamin 2500 MCG CHEW Chew by mouth.     DHA-EPA-Flaxseed Oil-Vitamin E (THERA TEARS NUTRITION PO) Take by mouth.     ELIQUIS 2.5 MG TABS tablet Take 1 tablet by mouth twice daily 180 tablet 0   Glucosamine-Chondroit-Vit C-Mn (GLUCOSAMINE 1500 COMPLEX PO) Take by mouth.     hydrochlorothiazide (MICROZIDE) 12.5 MG capsule Take 1 capsule by mouth once daily 90 capsule 0   methocarbamol (ROBAXIN) 500 MG tablet Take 1 tablet by mouth 2 (two) times daily.     Multiple Vitamins-Minerals (CENTRUM ADULTS PO) Take by mouth.     predniSONE (DELTASONE) 20 MG tablet Take 2 tablets (40 mg total) by mouth daily with breakfast. 10 tablet 0   rosuvastatin (CRESTOR) 20 MG tablet Take 10 mg by mouth in the morning and at bedtime.     vitamin A 8000 UNIT capsule Take 8,000 Units by mouth daily.     No current facility-administered medications on file prior to visit.     ROS see history of present illness  Objective  Physical Exam Vitals:   05/24/22 0918  BP: 120/70  Pulse: 80  Temp: 99.2 F (37.3 C)  SpO2: 99%    BP Readings from Last 3 Encounters:  05/24/22 120/70  04/27/22 (!) 162/90  12/27/21 140/82   Wt Readings from Last 3 Encounters:  05/24/22 143 lb 9.6 oz (65.1 kg)  04/27/22 142 lb (64.4 kg)  11/15/21 146 lb 3.2 oz (66.3 kg)    Physical Exam Constitutional:      General: She is not in acute distress.    Appearance: She is not diaphoretic.  HENT:     Head:   Cardiovascular:     Rate and Rhythm: Normal rate. Rhythm irregularly irregular.     Heart sounds: Normal heart sounds.  Pulmonary:     Effort: Pulmonary effort is normal.     Breath sounds: Normal breath sounds.  Skin:    General: Skin is warm and dry.  Neurological:     Mental Status: She is alert.     Comments: CN 3-12 intact, 5/5 strength in bilateral biceps, triceps, grip, quads, hamstrings, plantar and dorsiflexion, sensation to light touch  intact in bilateral UE and LE, normal rapid alternating movements, normal finger-to-nose, no pronator drift, negative Romberg      Assessment/Plan: Please see individual problem list.  Problem List Items Addressed This Visit     Atrial fibrillation (HCC) (Chronic)    Rate controlled.  She will continue Eliquis 2.5 mg twice daily.      Hypertension - Primary (Chronic)    Well-controlled.  She will continue HCTZ 12.5 mg daily and amlodipine 5 mg daily.      Relevant Orders   Basic Metabolic Panel (BMET)   Balance problem    Benign neurological exam.  We will check her sodium level.  Discussed doing physical therapy for her balance though she declines this as it has not been beneficial in the past.  Discussed wearing well-fitting shoes.  Discussed removing area rugs and any elevated edges from the walkways at her house.      COPD (chronic obstructive pulmonary disease) (HCC)    Emphysema noted on CT chest.  Presumed to have COPD based on smoking history and CT findings.  She can continue Symbicort on a daily basis.  Continue as needed albuterol.  Refills provided.      Relevant Medications   albuterol (VENTOLIN HFA) 108 (90 Base) MCG/ACT inhaler   budesonide-formoterol (SYMBICORT) 160-4.5 MCG/ACT inhaler   Hand injury    This occurred sometime ago.  She notes this has healed well.      Head injury    At the end of the visit the patients granddaughter noted the patient just told her that she fell yesterday and hit her head. CT scan ordered. Discussed risk of intracranial bleeding while on eliquis and the dangers of this not being evaluated. Patient was willing to have the CT scan completed.       Relevant Orders   CT HEAD WO CONTRAST ( )   Lung nodule seen on imaging study    Deemed benign on follow-up imaging in 2022.  No further follow-up was recommended.      Other Visit Diagnoses     Need for immunization against influenza       Relevant Orders   Flu Vaccine QUAD  High Dose(Fluad) (Completed)      Return in about 3 months (around 08/23/2022) for balance.   Marikay Alar, MD [facility]

## 2022-06-21 ENCOUNTER — Other Ambulatory Visit: Payer: Self-pay | Admitting: Family Medicine

## 2022-06-21 DIAGNOSIS — Z1231 Encounter for screening mammogram for malignant neoplasm of breast: Secondary | ICD-10-CM

## 2022-06-23 ENCOUNTER — Telehealth: Payer: Self-pay | Admitting: Family Medicine

## 2022-06-23 NOTE — Telephone Encounter (Signed)
Copied from [place] [id number]. Topic: Medicare AWV >> Jun 23, 2022 10:10 AM Jae Dire wrote: Reason for CRM:  Left message for patient to call back and schedule Medicare Annual Wellness Visit (AWV) in office.   If unable to come into the office for AWV,  please offer to do virtually or by telephone.  Last AWV: 03/29/2020  Please schedule at anytime with [place].  30 minute appointment for Virtual or phone 45 minute appointment for in office or Initial virtual/phone  Any questions, please contact me at [phone]

## 2022-06-29 ENCOUNTER — Ambulatory Visit (INDEPENDENT_AMBULATORY_CARE_PROVIDER_SITE_OTHER): Payer: Medicare Other

## 2022-06-29 VITALS — Ht 59.0 in | Wt 143.0 lb

## 2022-06-29 DIAGNOSIS — Z0000 Encounter for general adult medical examination without abnormal findings: Secondary | ICD-10-CM

## 2022-06-29 NOTE — Patient Instructions (Addendum)
Gail Pierce , Thank you for taking time to come for your Medicare Wellness Visit. I appreciate your ongoing commitment to your health goals. Please review the following plan we discussed and let me know if I can assist you in the future.   These are the goals we discussed:  Goals       Patient Stated     Maintain healthy lifestyle (pt-stated)      Stay active Healthy diet        This is a list of the screening recommended for you and due dates:  Health Maintenance  Topic Date Due   COVID-19 Vaccine (4 - Pfizer series) 07/15/2022*   Zoster (Shingles) Vaccine (1 of 2) 09/29/2022*   Tetanus Vaccine  09/11/2025   Pneumonia Vaccine  Completed   Flu Shot  Completed   DEXA scan (bone density measurement)  Completed   HPV Vaccine  Aged Out  *Topic was postponed. The date shown is not the original due date.    Advanced directives: End of life planning; Advance aging; Advanced directives discussed.  Copy of current HCPOA/Living Will requested.    Conditions/risks identified: none new  Next appointment: Follow up in one year for your annual wellness visit    Preventive Care 65 Years and Older, Female Preventive care refers to lifestyle choices and visits with your health care provider that can promote health and wellness. What does preventive care include? A yearly physical exam. This is also called an annual well check. Dental exams once or twice a year. Routine eye exams. Ask your health care provider how often you should have your eyes checked. Personal lifestyle choices, including: Daily care of your teeth and gums. Regular physical activity. Eating a healthy diet. Avoiding tobacco and drug use. Limiting alcohol use. Practicing safe sex. Taking low-dose aspirin every day. Taking vitamin and mineral supplements as recommended by your health care provider. What happens during an annual well check? The services and screenings done by your health care provider during your  annual well check will depend on your age, overall health, lifestyle risk factors, and family history of disease. Counseling  Your health care provider may ask you questions about your: Alcohol use. Tobacco use. Drug use. Emotional well-being. Home and relationship well-being. Sexual activity. Eating habits. History of falls. Memory and ability to understand (cognition). Work and work Statistician. Reproductive health. Screening  You may have the following tests or measurements: Height, weight, and BMI. Blood pressure. Lipid and cholesterol levels. These may be checked every 5 years, or more frequently if you are over 29 years old. Skin check. Lung cancer screening. You may have this screening every year starting at age 95 if you have a 30-pack-year history of smoking and currently smoke or have quit within the past 15 years. Fecal occult blood test (FOBT) of the stool. You may have this test every year starting at age 58. Flexible sigmoidoscopy or colonoscopy. You may have a sigmoidoscopy every 5 years or a colonoscopy every 10 years starting at age 70. Hepatitis C blood test. Hepatitis B blood test. Sexually transmitted disease (STD) testing. Diabetes screening. This is done by checking your blood sugar (glucose) after you have not eaten for a while (fasting). You may have this done every 1-3 years. Bone density scan. This is done to screen for osteoporosis. You may have this done starting at age 59. Mammogram. This may be done every 1-2 years. Talk to your health care provider about how often you should have  regular mammograms. Talk with your health care provider about your test results, treatment options, and if necessary, the need for more tests. Vaccines  Your health care provider may recommend certain vaccines, such as: Influenza vaccine. This is recommended every year. Tetanus, diphtheria, and acellular pertussis (Tdap, Td) vaccine. You may need a Td booster every 10  years. Zoster vaccine. You may need this after age 33. Pneumococcal 13-valent conjugate (PCV13) vaccine. One dose is recommended after age 76. Pneumococcal polysaccharide (PPSV23) vaccine. One dose is recommended after age 11. Talk to your health care provider about which screenings and vaccines you need and how often you need them. This information is not intended to replace advice given to you by your health care provider. Make sure you discuss any questions you have with your health care provider. Document Released: 09/24/2015 Document Revised: 05/17/2016 Document Reviewed: 06/29/2015 Elsevier Interactive Patient Education  2017 [place] Prevention in the Home Falls can cause injuries. They can happen to people of all ages. There are many things you can do to make your home safe and to help prevent falls. What can I do on the outside of my home? Regularly fix the edges of walkways and driveways and fix any cracks. Remove anything that might make you trip as you walk through a door, such as a raised step or threshold. Trim any bushes or trees on the path to your home. Use bright outdoor lighting. Clear any walking paths of anything that might make someone trip, such as rocks or tools. Regularly check to see if handrails are loose or broken. Make sure that both sides of any steps have handrails. Any raised decks and porches should have guardrails on the edges. Have any leaves, snow, or ice cleared regularly. Use sand or salt on walking paths during winter. Clean up any spills in your garage right away. This includes oil or grease spills. What can I do in the bathroom? Use night lights. Install grab bars by the toilet and in the tub and shower. Do not use towel bars as grab bars. Use non-skid mats or decals in the tub or shower. If you need to sit down in the shower, use a plastic, non-slip stool. Keep the floor dry. Clean up any water that spills on the floor as soon as it  happens. Remove soap buildup in the tub or shower regularly. Attach bath mats securely with double-sided non-slip rug tape. Do not have throw rugs and other things on the floor that can make you trip. What can I do in the bedroom? Use night lights. Make sure that you have a light by your bed that is easy to reach. Do not use any sheets or blankets that are too big for your bed. They should not hang down onto the floor. Have a firm chair that has side arms. You can use this for support while you get dressed. Do not have throw rugs and other things on the floor that can make you trip. What can I do in the kitchen? Clean up any spills right away. Avoid walking on wet floors. Keep items that you use a lot in easy-to-reach places. If you need to reach something above you, use a strong step stool that has a grab bar. Keep electrical cords out of the way. Do not use floor polish or wax that makes floors slippery. If you must use wax, use non-skid floor wax. Do not have throw rugs and other things on the floor that  can make you trip. What can I do with my stairs? Do not leave any items on the stairs. Make sure that there are handrails on both sides of the stairs and use them. Fix handrails that are broken or loose. Make sure that handrails are as long as the stairways. Check any carpeting to make sure that it is firmly attached to the stairs. Fix any carpet that is loose or worn. Avoid having throw rugs at the top or bottom of the stairs. If you do have throw rugs, attach them to the floor with carpet tape. Make sure that you have a light switch at the top of the stairs and the bottom of the stairs. If you do not have them, ask someone to add them for you. What else can I do to help prevent falls? Wear shoes that: Do not have high heels. Have rubber bottoms. Are comfortable and fit you well. Are closed at the toe. Do not wear sandals. If you use a stepladder: Make sure that it is fully opened.  Do not climb a closed stepladder. Make sure that both sides of the stepladder are locked into place. Ask someone to hold it for you, if possible. Clearly mark and make sure that you can see: Any grab bars or handrails. First and last steps. Where the edge of each step is. Use tools that help you move around (mobility aids) if they are needed. These include: Canes. Walkers. Scooters. Crutches. Turn on the lights when you go into a dark area. Replace any light bulbs as soon as they burn out. Set up your furniture so you have a clear path. Avoid moving your furniture around. If any of your floors are uneven, fix them. If there are any pets around you, be aware of where they are. Review your medicines with your doctor. Some medicines can make you feel dizzy. This can increase your chance of falling. Ask your doctor what other things that you can do to help prevent falls. This information is not intended to replace advice given to you by your health care provider. Make sure you discuss any questions you have with your health care provider. Document Released: 06/24/2009 Document Revised: 02/03/2016 Document Reviewed: 10/02/2014 Elsevier Interactive Patient Education  2017 [employer].

## 2022-06-29 NOTE — Progress Notes (Signed)
Subjective:   Gail Pierce is a 88 y.o. female who presents for Medicare Annual (Subsequent) preventive examination.  Review of Systems    No ROS.  Medicare Wellness Virtual Visit.  Visual/audio telehealth visit, UTA vital signs.   See social history for additional risk factors.   Cardiac Risk Factors include: advanced age (>2men, >54 women)     Objective:    Today's Vitals   06/29/22 1012  Weight: 143 lb (64.9 kg)  Height: 4\' 11"  (1.499 m)   Body mass index is 28.88 kg/m.     06/29/2022   10:18 AM 08/25/2020    9:49 AM 06/21/2020    3:48 PM 03/29/2020   10:52 AM 03/27/2019   10:50 AM 03/25/2018    8:42 AM  Advanced Directives  Does Patient Have a Medical Advance Directive? Yes Yes Yes Yes Yes Yes  Type of Paramedic of Accident;Living will  [place];Living will [place];Living will [place];Living will  Does patient want to make changes to medical advance directive? No - Patient declined No - Patient declined  No - Patient declined No - Patient declined No - Patient declined  Copy of [place] in Chart? No - copy requested No - copy requested  No - copy requested No - copy requested No - copy requested    Current Medications (verified) Outpatient Encounter Medications as of 06/29/2022  Medication Sig   albuterol (VENTOLIN HFA) 108 (90 Base) MCG/ACT inhaler Inhale 1-2 puffs into the lungs every 6 (six) hours as needed for wheezing or shortness of breath.   alendronate (FOSAMAX) 70 MG tablet Take 1 tablet (70 mg total) by mouth every 7 (seven) days. Take with a full glass of water on an empty stomach.   amLODipine (NORVASC) 5 MG tablet Take 1 tablet by mouth daily.   Apoaequorin (PREVAGEN) 10 MG CAPS Take 10 mg by mouth daily.   Ascorbic Acid (VITAMIN C) 100 MG tablet Take 100 mg by mouth daily.   budesonide-formoterol (SYMBICORT)  160-4.5 MCG/ACT inhaler Inhale 2 puffs into the lungs 2 (two) times daily. Rinse mouth after use   Calcium Carbonate-Vitamin D (CALCIUM 500/D PO) Take by mouth.   Cholecalciferol (VITAMIN D3) 1000 units CAPS Take by mouth.   ciclopirox (PENLAC) 8 % solution Apply over nail and surrounding skin. Apply daily over previous coat. After seven (7) days, may remove with alcohol and continue cycle.   Corn Dextrin (EASY FIBER PO) Take by mouth.   Cyanocobalamin 2500 MCG CHEW Chew by mouth.   DHA-EPA-Flaxseed Oil-Vitamin E (THERA TEARS NUTRITION PO) Take by mouth.   ELIQUIS 2.5 MG TABS tablet Take 1 tablet by mouth twice daily   Glucosamine-Chondroit-Vit C-Mn (GLUCOSAMINE 1500 COMPLEX PO) Take by mouth.   hydrochlorothiazide (MICROZIDE) 12.5 MG capsule Take 1 capsule by mouth once daily   methocarbamol (ROBAXIN) 500 MG tablet Take 1 tablet by mouth 2 (two) times daily.   Multiple Vitamins-Minerals (CENTRUM ADULTS PO) Take by mouth.   predniSONE (DELTASONE) 20 MG tablet Take 2 tablets (40 mg total) by mouth daily with breakfast.   rosuvastatin (CRESTOR) 20 MG tablet Take 10 mg by mouth in the morning and at bedtime.   vitamin A 8000 UNIT capsule Take 8,000 Units by mouth daily.   No facility-administered encounter medications on file as of 06/29/2022.    Allergies (verified) Patient has no known allergies.   History: Past Medical History:  Diagnosis Date   Allergy    COVID-19    02/06/21   Hx of diverticulitis of colon    Hypertension    Past Surgical History:  Procedure Laterality Date   BREAST EXCISIONAL BIOPSY Left 1980   SEPTOPLASTY     TONSILLECTOMY     Family History  Problem Relation Age of Onset   Leukemia Mother    Heart attack Father    Breast cancer Neg Hx    Social History   Socioeconomic History   Marital status: Widowed    Spouse name: Not on file   Number of children: Not on file   Years of education: Not on file   Highest education level: Not on file   Occupational History   Not on file  Tobacco Use   Smoking status: Former   Smokeless tobacco: Never  Vaping Use   Vaping Use: Never used  Substance and Sexual Activity   Alcohol use: Yes    Comment: OCC   Drug use: No   Sexual activity: Not on file  Other Topics Concern   Not on file  Social History Narrative   Not on file   Social Determinants of Health   Financial Resource Strain: Low Risk  (06/29/2022)   Overall Financial Resource Strain (CARDIA)    Difficulty of Paying Living Expenses: Not hard at all  Food Insecurity: No Food Insecurity (06/29/2022)   Hunger Vital Sign    Worried About Running Out of Food in the Last Year: Never true    Ran Out of Food in the Last Year: Never true  Transportation Needs: No Transportation Needs (06/29/2022)   PRAPARE - Administrator, Civil Service (Medical): No    Lack of Transportation (Non-Medical): No  Physical Activity: Sufficiently Active (06/29/2022)   Exercise Vital Sign    Days of Exercise per Week: 7 days    Minutes of Exercise per Session: 60 min  Stress: No Stress Concern Present (06/29/2022)   [employer] of Occupational Health - Occupational Stress Questionnaire    Feeling of Stress : Not at all  Social Connections: Moderately Isolated (06/29/2022)   Social Connection and Isolation Panel [NHANES]    Frequency of Communication with Friends and Family: More than three times a week    Frequency of Social Gatherings with Friends and Family: More than three times a week    Attends Religious Services: Never    Database administrator or Organizations: Yes    Attends Banker Meetings: 1 to 4 times per year    Marital Status: Widowed    Tobacco Counseling Counseling given: Not Answered   Clinical Intake:  Pre-visit preparation completed: Yes        Diabetes: No  How often do you need to have someone help you when you read instructions, pamphlets, or other written materials from your  doctor or pharmacy?: 1 - Never   Interpreter Needed?: No      Activities of Daily Living    06/29/2022   10:21 AM  In your present state of health, do you have any difficulty performing the following activities:  Hearing? 1  Comment Hearing aid  Vision? 0  Difficulty concentrating or making decisions? 0  Walking or climbing stairs? 1  Comment Cane in use  Dressing or bathing? 0  Doing errands, shopping? 0  Preparing Food and eating ? N  Using the Toilet? N  In the past six months, have you accidently leaked urine?  N  Do you have problems with loss of bowel control? N  Managing your Medications? N  Managing your Finances? N  Housekeeping or managing your Housekeeping? N    Patient Care Team: Glori Luis, MD as PCP - General (Family Medicine) Debbe Odea, MD as Consulting Physician (Cardiology)  Indicate any recent Medical Services you may have received from other than [facility] providers in the past year (date may be approximate).     Assessment:   This is a routine wellness examination for Jolie.  I connected with  Michell Nelon on 06/29/22 by a audio enabled telemedicine application and verified that I am speaking with the correct person using two identifiers.  Patient Location: Home  Provider Location: Office/Clinic  I discussed the limitations of evaluation and management by telemedicine. The patient expressed understanding and agreed to proceed.   Hearing/Vision screen Hearing Screening - Comments:: Hearing aid Vision Screening - Comments:: Followed by [facility]  Wears corrective lenses Cataract extraction, bilateral They have seen their ophthalmologist in the last 12 months.  Dietary issues and exercise activities discussed: Current Exercise Habits: Home exercise routine, Type of exercise: walking, Intensity: Mild Healthy diet Good water intake    Goals Addressed               This Visit's Progress     Patient  Stated     Maintain healthy lifestyle (pt-stated)        Stay active Healthy diet       Depression Screen    06/29/2022   10:17 AM 05/24/2022    9:21 AM 11/15/2021   11:56 AM 06/03/2021    8:34 AM 11/30/2020    9:49 AM 07/21/2020    9:33 AM 03/29/2020   10:47 AM  PHQ 2/9 Scores  PHQ - 2 Score 0 0 0 0 0 0 0    Fall Risk    06/29/2022   10:25 AM 05/24/2022    9:20 AM 11/15/2021   11:56 AM 06/03/2021    8:33 AM 02/10/2021    7:35 AM  Fall Risk   Falls in the past year?  1 0 1 0  Comment None since last reported one month ago.      Number falls in past yr:  0 0 0 0  Injury with Fall?  1 0 0 0  Risk for fall due to : Impaired balance/gait Impaired balance/gait No Fall Risks    Risk for fall due to: Comment Cane in use as tolerated      Follow up Falls evaluation completed Falls evaluation completed Falls evaluation completed Falls evaluation completed Falls evaluation completed    FALL RISK PREVENTION PERTAINING TO THE HOME: Home free of loose throw rugs in walkways, pet beds, electrical cords, etc? Yes  Adequate lighting in your home to reduce risk of falls? Yes   ASSISTIVE DEVICES UTILIZED TO PREVENT FALLS: Life alert? No  Use of a cane, walker or w/c? Yes, as needed Grab bars in the bathroom? No   TIMED UP AND GO: Was the test performed? No .   Cognitive Function:    03/25/2018    8:46 AM  MMSE - Mini Mental State Exam  Orientation to time 5  Orientation to Place 5  Registration 3  Attention/ Calculation 5  Recall 2  Language- name 2 objects 2  Language- repeat 1  Language- follow 3 step command 3  Language- read & follow direction 1  Write a sentence 1  Copy  design 1  Total score 29        06/29/2022   10:21 AM 03/29/2020   10:49 AM 03/27/2019   10:46 AM  6CIT Screen  What Year? 0 points 0 points 0 points  What month? 0 points 0 points 0 points  What time? 0 points 0 points 0 points  Count back from 20 0 points 0 points 0 points  Months in reverse 0  points 0 points 0 points  Repeat phrase 4 points 6 points 0 points  Total Score 4 points 6 points 0 points    Immunizations Immunization History  Administered Date(s) Administered   Fluad Quad(high Dose 65+) 06/03/2021, 05/24/2022   Influenza, High Dose Seasonal PF 05/28/2017, 05/13/2018, 06/10/2018, 06/03/2019   Influenza,inj,quad, With Preservative 06/11/2020   PFIZER(Purple Top)SARS-COV-2 Vaccination 10/17/2019, 11/11/2019, 04/10/2020   Pneumococcal Conjugate-13 08/28/2011   Pneumococcal Polysaccharide-23 12/31/2013   Pneumococcal-Unspecified 08/28/2011   Td 09/12/2015   Shingrix Completed?: No.    Education has been provided regarding the importance of this vaccine. Patient has been advised to call insurance company to determine out of pocket expense if they have not yet received this vaccine. Advised may also receive vaccine at local pharmacy or Health Dept. Verbalized acceptance and understanding.  Screening Tests Health Maintenance  Topic Date Due   COVID-19 Vaccine (4 - Pfizer series) 07/15/2022 (Originally 06/05/2020)   Zoster Vaccines- Shingrix (1 of 2) 09/29/2022 (Originally 08/16/1983)   TETANUS/TDAP  09/11/2025   Pneumonia Vaccine 11+ Years old  Completed   INFLUENZA VACCINE  Completed   DEXA SCAN  Completed   HPV VACCINES  Aged Out   Health Maintenance There are no preventive care reminders to display for this patient.  Lung Cancer Screening: (Low Dose CT Chest recommended if Age 24-80 years, 30 pack-year currently smoking OR have quit w/in 15years.) does not qualify.   Hepatitis C Screening: does not qualify.  Vision Screening: Recommended annual ophthalmology exams for early detection of glaucoma and other disorders of the eye.  Dental Screening: Recommended annual dental exams for proper oral hygiene  Community Resource Referral / Chronic Care Management: CRR required this visit?  No   CCM required this visit?  No      Plan:     I have personally  reviewed and noted the following in the patient's chart:   Medical and social history Use of alcohol, tobacco or illicit drugs  Current medications and supplements including opioid prescriptions. Patient is not currently taking opioid prescriptions. Functional ability and status Nutritional status Physical activity Advanced directives List of other physicians Hospitalizations, surgeries, and ER visits in previous 12 months Vitals Screenings to include cognitive, depression, and falls Referrals and appointments  In addition, I have reviewed and discussed with patient certain preventive protocols, quality metrics, and best practice recommendations. A written personalized care plan for preventive services as well as general preventive health recommendations were provided to patient.     Cathey Endow, LPN   [id number]

## 2022-07-24 DIAGNOSIS — H35372 Puckering of macula, left eye: Secondary | ICD-10-CM | POA: Diagnosis not present

## 2022-07-31 ENCOUNTER — Ambulatory Visit
Admission: RE | Admit: 2022-07-31 | Discharge: 2022-07-31 | Disposition: A | Discharge status: Home / Self Care | Payer: Medicare Other | Source: Ambulatory Visit | Attending: Family Medicine | Admitting: Family Medicine

## 2022-07-31 DIAGNOSIS — Z1231 Encounter for screening mammogram for malignant neoplasm of breast: Secondary | ICD-10-CM | POA: Insufficient documentation

## 2022-08-04 ENCOUNTER — Other Ambulatory Visit: Payer: Self-pay | Admitting: Family Medicine

## 2022-08-17 ENCOUNTER — Other Ambulatory Visit: Payer: Self-pay | Admitting: Family Medicine

## 2022-08-17 DIAGNOSIS — J449 Chronic obstructive pulmonary disease, unspecified: Secondary | ICD-10-CM

## 2022-08-23 ENCOUNTER — Ambulatory Visit: Payer: Medicare Other | Admitting: Family Medicine

## 2022-08-31 ENCOUNTER — Encounter: Payer: Self-pay | Admitting: Family Medicine

## 2022-08-31 ENCOUNTER — Ambulatory Visit (INDEPENDENT_AMBULATORY_CARE_PROVIDER_SITE_OTHER): Payer: Medicare Other | Admitting: Family Medicine

## 2022-08-31 VITALS — BP 120/70 | HR 65 | Temp 97.8°F | Ht 59.0 in | Wt 145.0 lb

## 2022-08-31 DIAGNOSIS — R252 Cramp and spasm: Secondary | ICD-10-CM

## 2022-08-31 DIAGNOSIS — I4821 Permanent atrial fibrillation: Secondary | ICD-10-CM

## 2022-08-31 DIAGNOSIS — R2689 Other abnormalities of gait and mobility: Secondary | ICD-10-CM

## 2022-08-31 DIAGNOSIS — M8580 Other specified disorders of bone density and structure, unspecified site: Secondary | ICD-10-CM

## 2022-08-31 DIAGNOSIS — I10 Essential (primary) hypertension: Secondary | ICD-10-CM | POA: Diagnosis not present

## 2022-08-31 DIAGNOSIS — I700 Atherosclerosis of aorta: Secondary | ICD-10-CM | POA: Diagnosis not present

## 2022-08-31 DIAGNOSIS — I480 Paroxysmal atrial fibrillation: Secondary | ICD-10-CM

## 2022-08-31 DIAGNOSIS — S6991XA Unspecified injury of right wrist, hand and finger(s), initial encounter: Secondary | ICD-10-CM

## 2022-08-31 LAB — BASIC METABOLIC PANEL
BUN: 15 mg/dL (ref 6–23)
CO2: 28 mEq/L (ref 19–32)
Calcium: 10.3 mg/dL (ref 8.4–10.5)
Chloride: 99 mEq/L (ref 96–112)
Creatinine, Ser: 0.74 mg/dL (ref 0.40–1.20)
GFR: 71.92 mL/min (ref >60.00)
Glucose, Bld: 141 mg/dL — ABNORMAL HIGH (ref 70–99)
Potassium: 4.1 mEq/L (ref 3.5–5.1)
Sodium: 135 mEq/L (ref 135–145)

## 2022-08-31 LAB — MAGNESIUM: Magnesium: 1.9 mg/dL (ref 1.5–2.5)

## 2022-08-31 MED ORDER — APIXABAN 2.5 MG PO TABS: 2.5000 mg | ORAL_TABLET | Freq: Two times a day (BID) | ORAL | 3 refills | Status: AC

## 2022-08-31 NOTE — Assessment & Plan Note (Signed)
Chronic issue.  Adequately controlled.  She will continue amlodipine 5 mg daily and hydrochlorothiazide 12.5 mg daily.

## 2022-08-31 NOTE — Assessment & Plan Note (Addendum)
She will increase her water intake to 4.5-5 glasses of water daily.  She will continue to stretch.  Will check electrolytes.  Discussed she could try taking yellow mustard at home to see if that would be helpful.

## 2022-08-31 NOTE — Assessment & Plan Note (Signed)
Chronic issue.  Patient will call to schedule her bone density scan.

## 2022-08-31 NOTE — Progress Notes (Signed)
Marikay Alar, MD Phone: [phone]  Gail Pierce is a 88 y.o. female who presents today for follow-up.  HYPERTENSION Disease Monitoring Home BP Monitoring not checking Chest pain- no    Dyspnea- no Medications Compliance-  taking amlodipine, HCTZ.  Edema- no BMET    Component Value Date/Time   NA 134 (L) 05/24/2022 1028   K 4.3 05/24/2022 1028   CL 100 05/24/2022 1028   CO2 25 05/24/2022 1028   GLUCOSE 95 05/24/2022 1028   BUN 17 05/24/2022 1028   CREATININE 0.79 05/24/2022 1028   CREATININE 0.76 02/09/2017 1613   CALCIUM 10.5 05/24/2022 1028   Atrial fibrillation: Patient is on Eliquis.  No palpitations.  No bleeding issues.  Cramps: Patient if she gets cramps in her legs 1-2 times a week.  She does stretch some.  She drinks 3.5 glasses of water daily.  Balance difficulty: Patient notes this is stable.  She is been using a cane and also has her granddaughter who comes and helps her get around as well.  She notes no falls.  She declines physical therapy.  History of hand injury: Patient reports her right hand has not worked normally since she cut her fingers.  She notes she does some exercises on her own that are beneficial.  She declines occupational therapy for this.  Social History   Tobacco Use  Smoking Status Former  Smokeless Tobacco Never    Current Outpatient Medications on File Prior to Visit  Medication Sig Dispense Refill   albuterol (VENTOLIN HFA) 108 (90 Base) MCG/ACT inhaler Inhale 1-2 puffs into the lungs every 6 (six) hours as needed for wheezing or shortness of breath. 18 g 2   alendronate (FOSAMAX) 70 MG tablet Take 1 tablet (70 mg total) by mouth every 7 (seven) days. Take with a full glass of water on an empty stomach. 13 tablet 3   amLODipine (NORVASC) 5 MG tablet Take 1 tablet by mouth daily.     Apoaequorin (PREVAGEN) 10 MG CAPS Take 10 mg by mouth daily.     Ascorbic Acid (VITAMIN C) 100 MG tablet Take 100 mg by mouth daily.      Calcium Carbonate-Vitamin D (CALCIUM 500/D PO) Take by mouth.     Cholecalciferol (VITAMIN D3) 1000 units CAPS Take by mouth.     ciclopirox (PENLAC) 8 % solution Apply over nail and surrounding skin. Apply daily over previous coat. After seven (7) days, may remove with alcohol and continue cycle. 6 mL 1   Corn Dextrin (EASY FIBER PO) Take by mouth.     Cyanocobalamin 2500 MCG CHEW Chew by mouth.     DHA-EPA-Flaxseed Oil-Vitamin E (THERA TEARS NUTRITION PO) Take by mouth.     Glucosamine-Chondroit-Vit C-Mn (GLUCOSAMINE 1500 COMPLEX PO) Take by mouth.     hydrochlorothiazide (MICROZIDE) 12.5 MG capsule Take 1 capsule by mouth once daily 90 capsule 0   methocarbamol (ROBAXIN) 500 MG tablet Take 1 tablet by mouth 2 (two) times daily.     Multiple Vitamins-Minerals (CENTRUM ADULTS PO) Take by mouth.     predniSONE (DELTASONE) 20 MG tablet Take 2 tablets (40 mg total) by mouth daily with breakfast. 10 tablet 0   rosuvastatin (CRESTOR) 20 MG tablet Take 10 mg by mouth in the morning and at bedtime.     SYMBICORT 160-4.5 MCG/ACT inhaler INHALE 2 PUFFS BY MOUTH TWICE DAILY RINSE MOUTH AFTER USE 11 g 1   vitamin A 8000 UNIT capsule Take 8,000 Units by mouth daily.  No current facility-administered medications on file prior to visit.     ROS see history of present illness  Objective  Physical Exam Vitals:   08/31/22 0937  BP: 120/70  Pulse: 65  Temp: 97.8 F (36.6 C)  SpO2: 99%    BP Readings from Last 3 Encounters:  08/31/22 120/70  05/24/22 120/70  04/27/22 (!) 162/90   Wt Readings from Last 3 Encounters:  08/31/22 145 lb (65.8 kg)  06/29/22 143 lb (64.9 kg)  05/24/22 143 lb 9.6 oz (65.1 kg)    Physical Exam Constitutional:      General: She is not in acute distress.    Appearance: She is not diaphoretic.  Cardiovascular:     Rate and Rhythm: Normal rate and regular rhythm.     Heart sounds: Normal heart sounds.  Pulmonary:     Effort: Pulmonary effort is normal.      Breath sounds: Normal breath sounds.  Musculoskeletal:     Right lower leg: No edema.     Left lower leg: No edema.  Skin:    General: Skin is warm and dry.  Neurological:     Mental Status: She is alert.      Assessment/Plan: Please see individual problem list.  Primary hypertension Assessment & Plan: Chronic issue.  Adequately controlled.  She will continue amlodipine 5 mg daily and hydrochlorothiazide 12.5 mg daily.   Permanent atrial fibrillation [facility]) Assessment & Plan: Chronic issue.  Sinus rhythm today.  She will continue Eliquis 2.5 mg twice daily.   Osteopenia, unspecified location Assessment & Plan: Chronic issue.  Patient will call to schedule her bone density scan.  Orders: -     DG Bone Density; Future  Balance problem Assessment & Plan: Balance is stable.  She will continue to use her cane and have relatives help with her mobility.  She declines any physical therapy for this.   Injury of right hand, initial encounter Assessment & Plan: Offered occupational therapy to help with hand mobility though she declines this.  She will continue to do exercises at home on her own.   Muscle cramps Assessment & Plan: She will increase her water intake to 4.5-5 glasses of water daily.  She will continue to stretch.  Will check electrolytes.  Discussed she could try taking yellow mustard at home to see if that would be helpful.  Orders: -     Magnesium -     Basic metabolic panel  Paroxysmal atrial fibrillation (HCC) Assessment & Plan: Chronic issue.  Sinus rhythm today.  She will continue Eliquis 2.5 mg twice daily.  Orders: -     Apixaban; Take 1 tablet (2.5 mg total) by mouth 2 (two) times daily.  Dispense: 180 tablet; Refill: 3  Aortic atherosclerosis (HCC) Assessment & Plan: Continue risk factor management.     Return in about 6 months (around 03/02/2023).   Marikay Alar, MD [facility]

## 2022-08-31 NOTE — Assessment & Plan Note (Signed)
Chronic issue.  Sinus rhythm today.  She will continue Eliquis 2.5 mg twice daily.

## 2022-08-31 NOTE — Assessment & Plan Note (Signed)
Continue risk factor management. 

## 2022-08-31 NOTE — Assessment & Plan Note (Signed)
Offered occupational therapy to help with hand mobility though she declines this.  She will continue to do exercises at home on her own.

## 2022-08-31 NOTE — Assessment & Plan Note (Signed)
Balance is stable.  She will continue to use her cane and have relatives help with her mobility.  She declines any physical therapy for this.

## 2022-08-31 NOTE — Patient Instructions (Signed)
Nice to see you. Please call [phone] to schedule your bone density scan. Please increase your water intake to 3.5-5 glasses of water daily and continue to stretch to see if that helps with your cramps.

## 2022-09-06 ENCOUNTER — Telehealth: Payer: Self-pay | Admitting: Family Medicine

## 2022-09-06 NOTE — Telephone Encounter (Signed)
Pt daughter returning call 

## 2022-09-06 NOTE — Telephone Encounter (Signed)
I spoke with daughter and gave lab results. Se results  Aracelys Glade,cma

## 2022-09-13 ENCOUNTER — Telehealth: Payer: Self-pay | Admitting: Internal Medicine

## 2022-09-13 DIAGNOSIS — Z743 Need for continuous supervision: Secondary | ICD-10-CM | POA: Diagnosis not present

## 2022-09-13 DIAGNOSIS — R404 Transient alteration of awareness: Secondary | ICD-10-CM | POA: Diagnosis not present

## 2022-09-13 NOTE — Telephone Encounter (Signed)
Received a phone call from the on-call service.  Reportedly the patient was found dead.  The on-call service tried unsuccessfully to contact me with first responders. Will notify PCP.

## 2022-09-14 NOTE — Telephone Encounter (Signed)
Noted. Will await death certificate request.

## 2022-09-15 ENCOUNTER — Telehealth: Payer: Self-pay

## 2022-09-15 NOTE — Telephone Encounter (Signed)
I received a call from [employer] home and the family would like to cremate the patient this weekend and they would like the death certificate signed today on the [place] web site.  Rheda Kassab,cma

## 2022-09-15 NOTE — Telephone Encounter (Signed)
Completed.

## 2022-10-10 ENCOUNTER — Other Ambulatory Visit: Payer: Self-pay | Admitting: Family Medicine

## 2022-10-12 DIAGNOSIS — 419620001 Death: Secondary | SNOMED CT | POA: Diagnosis not present

## 2022-10-12 DEATH — deceased

## 2023-03-02 ENCOUNTER — Ambulatory Visit: Payer: Medicare Other | Admitting: Family Medicine

## 2024-07-29 NOTE — Telephone Encounter (Signed)
 open in error
